# Patient Record
Sex: Male | Born: 1960 | Race: White | Hispanic: No | Marital: Married | Smoking: Current every day smoker
Health system: Southern US, Community
[De-identification: ages and names within clinical notes are randomized; demographics above are authoritative.]

## PROBLEM LIST (undated history)

## (undated) DIAGNOSIS — S0591XA Unspecified injury of right eye and orbit, initial encounter: Secondary | ICD-10-CM

## (undated) DIAGNOSIS — M1990 Unspecified osteoarthritis, unspecified site: Secondary | ICD-10-CM

## (undated) DIAGNOSIS — F419 Anxiety disorder, unspecified: Secondary | ICD-10-CM

## (undated) DIAGNOSIS — Z923 Personal history of irradiation: Secondary | ICD-10-CM

## (undated) DIAGNOSIS — K219 Gastro-esophageal reflux disease without esophagitis: Secondary | ICD-10-CM

## (undated) DIAGNOSIS — G5601 Carpal tunnel syndrome, right upper limb: Secondary | ICD-10-CM

## (undated) DIAGNOSIS — R059 Cough, unspecified: Secondary | ICD-10-CM

## (undated) DIAGNOSIS — C801 Malignant (primary) neoplasm, unspecified: Secondary | ICD-10-CM

## (undated) DIAGNOSIS — R05 Cough: Secondary | ICD-10-CM

## (undated) HISTORY — PX: SHOULDER SURGERY: SHX246

## (undated) HISTORY — PX: EYE SURGERY: SHX253

## (undated) HISTORY — PX: OTHER SURGICAL HISTORY: SHX169

---

## 2007-08-12 ENCOUNTER — Emergency Department (HOSPITAL_COMMUNITY): Admission: EM | Admit: 2007-08-12 | Discharge: 2007-08-12 | Payer: Self-pay | Admitting: Emergency Medicine

## 2011-01-15 HISTORY — PX: EYE SURGERY: SHX253

## 2011-07-12 ENCOUNTER — Other Ambulatory Visit: Payer: Self-pay | Admitting: Family Medicine

## 2011-07-12 DIAGNOSIS — M79609 Pain in unspecified limb: Secondary | ICD-10-CM

## 2011-07-12 DIAGNOSIS — M5410 Radiculopathy, site unspecified: Secondary | ICD-10-CM

## 2011-07-12 DIAGNOSIS — M545 Low back pain: Secondary | ICD-10-CM

## 2011-07-14 ENCOUNTER — Other Ambulatory Visit: Payer: Self-pay

## 2011-07-14 ENCOUNTER — Other Ambulatory Visit: Payer: Self-pay | Admitting: Family Medicine

## 2011-07-14 DIAGNOSIS — Z139 Encounter for screening, unspecified: Secondary | ICD-10-CM

## 2011-07-17 ENCOUNTER — Ambulatory Visit
Admission: RE | Admit: 2011-07-17 | Discharge: 2011-07-17 | Disposition: A | Discharge status: Home / Self Care | Payer: BC Managed Care – PPO | Source: Ambulatory Visit | Attending: Family Medicine | Admitting: Family Medicine

## 2011-07-17 DIAGNOSIS — M545 Low back pain: Secondary | ICD-10-CM

## 2011-07-17 DIAGNOSIS — M79609 Pain in unspecified limb: Secondary | ICD-10-CM

## 2011-07-17 DIAGNOSIS — M5410 Radiculopathy, site unspecified: Secondary | ICD-10-CM

## 2012-10-16 ENCOUNTER — Encounter (HOSPITAL_COMMUNITY): Payer: Self-pay

## 2012-10-16 ENCOUNTER — Other Ambulatory Visit: Payer: Self-pay | Admitting: Neurosurgery

## 2012-10-21 ENCOUNTER — Encounter (HOSPITAL_COMMUNITY)
Admission: RE | Admit: 2012-10-21 | Discharge: 2012-10-21 | Disposition: A | Discharge status: Home / Self Care | Payer: BC Managed Care – PPO | Source: Ambulatory Visit | Attending: Neurosurgery | Admitting: Neurosurgery

## 2012-10-21 ENCOUNTER — Encounter (HOSPITAL_COMMUNITY): Payer: Self-pay

## 2012-10-21 HISTORY — DX: Cough: R05

## 2012-10-21 HISTORY — DX: Unspecified osteoarthritis, unspecified site: M19.90

## 2012-10-21 HISTORY — DX: Unspecified injury of right eye and orbit, initial encounter: S05.91XA

## 2012-10-21 HISTORY — DX: Cough, unspecified: R05.9

## 2012-10-21 HISTORY — DX: Anxiety disorder, unspecified: F41.9

## 2012-10-21 LAB — BASIC METABOLIC PANEL
BUN: 13 mg/dL (ref 6–23)
CO2: 24 mEq/L (ref 19–32)
Calcium: 9.6 mg/dL (ref 8.4–10.5)
Chloride: 101 mEq/L (ref 96–112)
Creatinine, Ser: 0.87 mg/dL (ref 0.50–1.35)
Glucose, Bld: 134 mg/dL — ABNORMAL HIGH (ref 70–99)

## 2012-10-21 LAB — CBC WITH DIFFERENTIAL/PLATELET
Basophils Absolute: 0 10*3/uL (ref 0.0–0.1)
Eosinophils Absolute: 0.1 10*3/uL (ref 0.0–0.7)
Eosinophils Relative: 1 % (ref 0–5)
HCT: 42.8 % (ref 39.0–52.0)
MCH: 32.8 pg (ref 26.0–34.0)
MCHC: 36 g/dL (ref 30.0–36.0)
MCV: 91.3 fL (ref 78.0–100.0)
Monocytes Absolute: 0.7 10*3/uL (ref 0.1–1.0)
Platelets: 339 10*3/uL (ref 150–400)
RDW: 13.6 % (ref 11.5–15.5)

## 2012-10-21 LAB — URINALYSIS, ROUTINE W REFLEX MICROSCOPIC
Bilirubin Urine: NEGATIVE
Ketones, ur: NEGATIVE mg/dL
Leukocytes, UA: NEGATIVE
Nitrite: NEGATIVE
Protein, ur: NEGATIVE mg/dL
pH: 7 (ref 5.0–8.0)

## 2012-10-21 LAB — SURGICAL PCR SCREEN: Staphylococcus aureus: NEGATIVE

## 2012-10-21 MED ORDER — CEFAZOLIN SODIUM-DEXTROSE 2-3 GM-% IV SOLR
2.0000 g | INTRAVENOUS | Status: AC
  Administered 2012-10-22: 2 g via INTRAVENOUS
  Filled 2012-10-21: qty 50

## 2012-10-21 NOTE — Pre-Procedure Instructions (Signed)
Richard Terry  10/21/2012   Your procedure is scheduled on:  10/22/12  Report to [facility] at 530 AM.  Call this number if you have problems the morning of surgery: [phone]   Remember:   Do not eat food or drink liquids after midnight.   Take these medicines the morning of surgery with A SIP OF WATER: zoloft   Do not wear jewelry, make-up or nail polish.  Do not wear lotions, powders, or perfumes. You may wear deodorant.  Do not shave 48 hours prior to surgery. Men may shave face and neck.  Do not bring valuables to the hospital.  Contacts, dentures or bridgework may not be worn into surgery.  Leave suitcase in the car. After surgery it may be brought to your room.  For patients admitted to the hospital, checkout time is 11:00 AM the day of  discharge.   Patients discharged the day of surgery will not be allowed to drive  home.  Name and phone number of your driver: family  Special Instructions: Shower using CHG 2 nights before surgery and the night before surgery.  If you shower the day of surgery use CHG.  Use special wash - you have one bottle of CHG for all showers.  You should use approximately 1/3 of the bottle for each shower.   Please read over the following fact sheets that you were given: Pain Booklet, Coughing and Deep Breathing, MRSA Information and Surgical Site Infection Prevention

## 2012-10-22 ENCOUNTER — Encounter (HOSPITAL_COMMUNITY): Payer: Self-pay | Admitting: *Deleted

## 2012-10-22 ENCOUNTER — Inpatient Hospital Stay (HOSPITAL_COMMUNITY): Payer: BC Managed Care – PPO

## 2012-10-22 ENCOUNTER — Inpatient Hospital Stay (HOSPITAL_COMMUNITY): Payer: BC Managed Care – PPO | Admitting: *Deleted

## 2012-10-22 ENCOUNTER — Inpatient Hospital Stay (HOSPITAL_COMMUNITY)
Admission: RE | Admit: 2012-10-22 | Discharge: 2012-10-23 | DRG: 864 | Disposition: A | Discharge status: Home / Self Care | Payer: BC Managed Care – PPO | Source: Ambulatory Visit | Attending: Neurosurgery | Admitting: Neurosurgery

## 2012-10-22 ENCOUNTER — Encounter (HOSPITAL_COMMUNITY)
Admission: RE | Disposition: A | Discharge status: Home / Self Care | Payer: Self-pay | Source: Ambulatory Visit | Attending: Neurosurgery

## 2012-10-22 ENCOUNTER — Encounter (HOSPITAL_COMMUNITY): Payer: Self-pay | Admitting: Surgery

## 2012-10-22 DIAGNOSIS — F411 Generalized anxiety disorder: Secondary | ICD-10-CM | POA: Diagnosis present

## 2012-10-22 DIAGNOSIS — Z87891 Personal history of nicotine dependence: Secondary | ICD-10-CM

## 2012-10-22 DIAGNOSIS — J45909 Unspecified asthma, uncomplicated: Secondary | ICD-10-CM | POA: Diagnosis present

## 2012-10-22 DIAGNOSIS — M5000 Cervical disc disorder with myelopathy, unspecified cervical region: Principal | ICD-10-CM | POA: Diagnosis present

## 2012-10-22 DIAGNOSIS — M4712 Other spondylosis with myelopathy, cervical region: Secondary | ICD-10-CM | POA: Diagnosis present

## 2012-10-22 HISTORY — PX: ANTERIOR CERVICAL DECOMP/DISCECTOMY FUSION: SHX1161

## 2012-10-22 SURGERY — ANTERIOR CERVICAL DECOMPRESSION/DISCECTOMY FUSION 3 LEVELS
Anesthesia: General | Site: Neck | Wound class: Clean

## 2012-10-22 MED ORDER — OXYCODONE HCL 5 MG PO TABS: 5.0000 mg | ORAL_TABLET | Freq: Once | ORAL | Status: DC | PRN

## 2012-10-22 MED ORDER — THROMBIN 5000 UNITS EX SOLR
OROMUCOSAL | Status: DC | PRN
  Administered 2012-10-22: 10:00:00 via TOPICAL

## 2012-10-22 MED ORDER — DEXAMETHASONE SODIUM PHOSPHATE 4 MG/ML IJ SOLN
INTRAMUSCULAR | Status: DC | PRN
  Administered 2012-10-22: 8 mg via INTRAVENOUS

## 2012-10-22 MED ORDER — PHENYLEPHRINE HCL 10 MG/ML IJ SOLN
INTRAMUSCULAR | Status: DC | PRN
  Administered 2012-10-22: 80 ug via INTRAVENOUS

## 2012-10-22 MED ORDER — MORPHINE SULFATE 2 MG/ML IJ SOLN: 1.0000 mg | INTRAMUSCULAR | Status: DC | PRN

## 2012-10-22 MED ORDER — METHOCARBAMOL 500 MG PO TABS
500.0000 mg | ORAL_TABLET | Freq: Four times a day (QID) | ORAL | Status: DC | PRN
  Administered 2012-10-22: 500 mg via ORAL
  Filled 2012-10-22: qty 1

## 2012-10-22 MED ORDER — SODIUM CHLORIDE 0.9 % IV SOLN
INTRAVENOUS | Status: AC
  Filled 2012-10-22: qty 500

## 2012-10-22 MED ORDER — LIDOCAINE HCL (CARDIAC) 20 MG/ML IV SOLN
INTRAVENOUS | Status: DC | PRN
  Administered 2012-10-22: 60 mg via INTRAVENOUS

## 2012-10-22 MED ORDER — HYDROMORPHONE HCL PF 1 MG/ML IJ SOLN
INTRAMUSCULAR | Status: AC
  Filled 2012-10-22: qty 1

## 2012-10-22 MED ORDER — HYDROMORPHONE HCL PF 1 MG/ML IJ SOLN
0.2500 mg | INTRAMUSCULAR | Status: DC | PRN
  Administered 2012-10-22 (×2): 0.5 mg via INTRAVENOUS

## 2012-10-22 MED ORDER — PROMETHAZINE HCL 25 MG/ML IJ SOLN: 12.5000 mg | INTRAMUSCULAR | Status: DC | PRN

## 2012-10-22 MED ORDER — ONDANSETRON HCL 4 MG/2ML IJ SOLN: 4.0000 mg | INTRAMUSCULAR | Status: DC | PRN

## 2012-10-22 MED ORDER — ALUM & MAG HYDROXIDE-SIMETH 200-200-20 MG/5ML PO SUSP
30.0000 mL | Freq: Four times a day (QID) | ORAL | Status: DC | PRN
  Administered 2012-10-22: 30 mL via ORAL
  Filled 2012-10-22: qty 30

## 2012-10-22 MED ORDER — ROCURONIUM BROMIDE 100 MG/10ML IV SOLN
INTRAVENOUS | Status: DC | PRN
  Administered 2012-10-22: 20 mg via INTRAVENOUS
  Administered 2012-10-22: 30 mg via INTRAVENOUS
  Administered 2012-10-22: 50 mg via INTRAVENOUS

## 2012-10-22 MED ORDER — FENTANYL CITRATE 0.05 MG/ML IJ SOLN
INTRAMUSCULAR | Status: DC | PRN
  Administered 2012-10-22: 100 ug via INTRAVENOUS
  Administered 2012-10-22: 50 ug via INTRAVENOUS
  Administered 2012-10-22: 150 ug via INTRAVENOUS
  Administered 2012-10-22: 100 ug via INTRAVENOUS

## 2012-10-22 MED ORDER — SODIUM CHLORIDE 0.9 % IV SOLN: 250.0000 mL | INTRAVENOUS | Status: DC

## 2012-10-22 MED ORDER — SODIUM CHLORIDE 0.9 % IJ SOLN
3.0000 mL | Freq: Two times a day (BID) | INTRAMUSCULAR | Status: DC
  Administered 2012-10-22: 3 mL via INTRAVENOUS

## 2012-10-22 MED ORDER — THROMBIN 5000 UNITS EX SOLR
CUTANEOUS | Status: DC | PRN
  Administered 2012-10-22 (×4): 5000 [IU] via TOPICAL

## 2012-10-22 MED ORDER — MIDAZOLAM HCL 5 MG/5ML IJ SOLN
INTRAMUSCULAR | Status: DC | PRN
  Administered 2012-10-22: 2 mg via INTRAVENOUS

## 2012-10-22 MED ORDER — MENTHOL 3 MG MT LOZG: 1.0000 | LOZENGE | OROMUCOSAL | Status: DC | PRN

## 2012-10-22 MED ORDER — PROMETHAZINE HCL 25 MG/ML IJ SOLN: 6.2500 mg | INTRAMUSCULAR | Status: DC | PRN

## 2012-10-22 MED ORDER — LACTATED RINGERS IV SOLN: INTRAVENOUS | Status: DC

## 2012-10-22 MED ORDER — CYCLOBENZAPRINE HCL 10 MG PO TABS
10.0000 mg | ORAL_TABLET | Freq: Three times a day (TID) | ORAL | Status: DC | PRN
  Administered 2012-10-22: 10 mg via ORAL
  Filled 2012-10-22: qty 1

## 2012-10-22 MED ORDER — DOCUSATE SODIUM 100 MG PO CAPS
100.0000 mg | ORAL_CAPSULE | Freq: Two times a day (BID) | ORAL | Status: DC
  Administered 2012-10-22 (×2): 100 mg via ORAL
  Filled 2012-10-22 (×2): qty 1

## 2012-10-22 MED ORDER — LACTATED RINGERS IV SOLN
INTRAVENOUS | Status: DC | PRN
  Administered 2012-10-22 (×3): via INTRAVENOUS

## 2012-10-22 MED ORDER — BACITRACIN 50000 UNITS IM SOLR
INTRAMUSCULAR | Status: AC
  Filled 2012-10-22: qty 1

## 2012-10-22 MED ORDER — MEPERIDINE HCL 25 MG/ML IJ SOLN: 6.2500 mg | INTRAMUSCULAR | Status: DC | PRN

## 2012-10-22 MED ORDER — PROPOFOL 10 MG/ML IV BOLUS
INTRAVENOUS | Status: DC | PRN
  Administered 2012-10-22: 200 mg via INTRAVENOUS

## 2012-10-22 MED ORDER — ONDANSETRON HCL 4 MG/2ML IJ SOLN
INTRAMUSCULAR | Status: DC | PRN
  Administered 2012-10-22: 4 mg via INTRAVENOUS

## 2012-10-22 MED ORDER — PROMETHAZINE HCL 25 MG PO TABS: 12.5000 mg | ORAL_TABLET | ORAL | Status: DC | PRN

## 2012-10-22 MED ORDER — ACETAMINOPHEN 325 MG PO TABS: 650.0000 mg | ORAL_TABLET | ORAL | Status: DC | PRN

## 2012-10-22 MED ORDER — HYDROCODONE-ACETAMINOPHEN 5-325 MG PO TABS: 1.0000 | ORAL_TABLET | ORAL | Status: DC | PRN

## 2012-10-22 MED ORDER — NEOSTIGMINE METHYLSULFATE 1 MG/ML IJ SOLN
INTRAMUSCULAR | Status: DC | PRN
  Administered 2012-10-22: 3.5 mg via INTRAVENOUS

## 2012-10-22 MED ORDER — PHENOL 1.4 % MT LIQD: 1.0000 | OROMUCOSAL | Status: DC | PRN

## 2012-10-22 MED ORDER — OXYCODONE HCL 5 MG/5ML PO SOLN: 5.0000 mg | Freq: Once | ORAL | Status: DC | PRN

## 2012-10-22 MED ORDER — KETOROLAC TROMETHAMINE 30 MG/ML IJ SOLN
30.0000 mg | Freq: Four times a day (QID) | INTRAMUSCULAR | Status: DC
  Administered 2012-10-22 – 2012-10-23 (×3): 30 mg via INTRAVENOUS
  Filled 2012-10-22 (×3): qty 1

## 2012-10-22 MED ORDER — LIDOCAINE-EPINEPHRINE 1 %-1:100000 IJ SOLN
INTRAMUSCULAR | Status: DC | PRN
  Administered 2012-10-22: 10 mL

## 2012-10-22 MED ORDER — OXYCODONE-ACETAMINOPHEN 5-325 MG PO TABS
1.0000 | ORAL_TABLET | ORAL | Status: DC | PRN
  Administered 2012-10-22 (×3): 2 via ORAL
  Filled 2012-10-22 (×3): qty 2

## 2012-10-22 MED ORDER — MAGNESIUM HYDROXIDE 400 MG/5ML PO SUSP: 30.0000 mL | Freq: Every day | ORAL | Status: DC | PRN

## 2012-10-22 MED ORDER — VECURONIUM BROMIDE 10 MG IV SOLR
INTRAVENOUS | Status: DC | PRN
  Administered 2012-10-22 (×2): 2 mg via INTRAVENOUS

## 2012-10-22 MED ORDER — GLYCOPYRROLATE 0.2 MG/ML IJ SOLN
INTRAMUSCULAR | Status: DC | PRN
  Administered 2012-10-22: .7 mg via INTRAVENOUS

## 2012-10-22 MED ORDER — ISOPROPYL ALCOHOL 70 % SOLN
Status: DC | PRN
  Administered 2012-10-22: 1 via TOPICAL

## 2012-10-22 MED ORDER — SODIUM CHLORIDE 0.9 % IR SOLN
Status: DC | PRN
  Administered 2012-10-22 (×2)

## 2012-10-22 MED ORDER — CEFAZOLIN SODIUM-DEXTROSE 2-3 GM-% IV SOLR
2.0000 g | Freq: Three times a day (TID) | INTRAVENOUS | Status: AC
  Administered 2012-10-22 (×2): 2 g via INTRAVENOUS
  Filled 2012-10-22 (×2): qty 50

## 2012-10-22 MED ORDER — BISACODYL 10 MG RE SUPP: 10.0000 mg | Freq: Every day | RECTAL | Status: DC | PRN

## 2012-10-22 MED ORDER — ZOLPIDEM TARTRATE 5 MG PO TABS: 5.0000 mg | ORAL_TABLET | Freq: Every evening | ORAL | Status: DC | PRN

## 2012-10-22 MED ORDER — 0.9 % SODIUM CHLORIDE (POUR BTL) OPTIME
TOPICAL | Status: DC | PRN
  Administered 2012-10-22: 1000 mL

## 2012-10-22 MED ORDER — EPHEDRINE SULFATE 50 MG/ML IJ SOLN
INTRAMUSCULAR | Status: DC | PRN
  Administered 2012-10-22: 5 mg via INTRAVENOUS

## 2012-10-22 MED ORDER — HEMOSTATIC AGENTS (NO CHARGE) OPTIME
TOPICAL | Status: DC | PRN
  Administered 2012-10-22 (×2): 1 via TOPICAL

## 2012-10-22 MED ORDER — SODIUM CHLORIDE 0.9 % IJ SOLN: 3.0000 mL | INTRAMUSCULAR | Status: DC | PRN

## 2012-10-22 MED ORDER — MIDAZOLAM HCL 2 MG/2ML IJ SOLN
0.5000 mg | Freq: Once | INTRAMUSCULAR | Status: AC | PRN
  Administered 2012-10-22: 2 mg via INTRAVENOUS

## 2012-10-22 MED ORDER — METHOCARBAMOL 100 MG/ML IJ SOLN
500.0000 mg | Freq: Four times a day (QID) | INTRAMUSCULAR | Status: DC | PRN
  Filled 2012-10-22: qty 5

## 2012-10-22 MED ORDER — ACETAMINOPHEN 650 MG RE SUPP: 650.0000 mg | RECTAL | Status: DC | PRN

## 2012-10-22 MED ORDER — SERTRALINE HCL 50 MG PO TABS
150.0000 mg | ORAL_TABLET | Freq: Every day | ORAL | Status: DC
  Filled 2012-10-22 (×2): qty 1

## 2012-10-22 SURGICAL SUPPLY — 57 items
BAG DECANTER FOR FLEXI CONT (MISCELLANEOUS) ×4 IMPLANT
BANDAGE GAUZE ELAST BULKY 4 IN (GAUZE/BANDAGES/DRESSINGS) ×4 IMPLANT
BENZOIN TINCTURE PRP APPL 2/3 (GAUZE/BANDAGES/DRESSINGS) ×2 IMPLANT
BIT DRILL 14MM (INSTRUMENTS) ×1 IMPLANT
BUR MATCHSTICK NEURO 3.0 LAGG (BURR) ×2 IMPLANT
CANISTER SUCTION 2500CC (MISCELLANEOUS) ×2 IMPLANT
CLOTH BEACON ORANGE TIMEOUT ST (SAFETY) ×2 IMPLANT
CONT SPEC 4OZ CLIKSEAL STRL BL (MISCELLANEOUS) ×2 IMPLANT
DRAPE LAPAROTOMY 100X72 PEDS (DRAPES) ×2 IMPLANT
DRAPE MICROSCOPE LEICA (MISCELLANEOUS) ×2 IMPLANT
DRAPE POUCH INSTRU U-SHP 10X18 (DRAPES) ×2 IMPLANT
DRILL 14MM (INSTRUMENTS) ×2
DURAPREP 6ML APPLICATOR 50/CS (WOUND CARE) ×2 IMPLANT
ELECT REM PT RETURN 9FT ADLT (ELECTROSURGICAL) ×2
ELECTRODE REM PT RTRN 9FT ADLT (ELECTROSURGICAL) ×1 IMPLANT
GAUZE SPONGE 4X4 16PLY XRAY LF (GAUZE/BANDAGES/DRESSINGS) IMPLANT
GLOVE BIO SURGEON STRL SZ8 (GLOVE) ×2 IMPLANT
GLOVE BIOGEL PI IND STRL 8.5 (GLOVE) ×2 IMPLANT
GLOVE BIOGEL PI INDICATOR 8.5 (GLOVE) ×2
GLOVE ECLIPSE 7.5 STRL STRAW (GLOVE) ×10 IMPLANT
GLOVE EXAM NITRILE LRG STRL (GLOVE) IMPLANT
GLOVE EXAM NITRILE MD LF STRL (GLOVE) IMPLANT
GLOVE EXAM NITRILE XL STR (GLOVE) IMPLANT
GLOVE EXAM NITRILE XS STR PU (GLOVE) IMPLANT
GLOVE INDICATOR 8.5 STRL (GLOVE) ×2 IMPLANT
GOWN BRE IMP SLV AUR LG STRL (GOWN DISPOSABLE) ×2 IMPLANT
GOWN BRE IMP SLV AUR XL STRL (GOWN DISPOSABLE) ×4 IMPLANT
GOWN STRL REIN 2XL LVL4 (GOWN DISPOSABLE) ×2 IMPLANT
HEAD HALTER (SOFTGOODS) ×2 IMPLANT
HEMOSTAT POWDER SURGIFOAM 1G (HEMOSTASIS) ×2 IMPLANT
KIT BASIN OR (CUSTOM PROCEDURE TRAY) ×2 IMPLANT
KIT ROOM TURNOVER OR (KITS) ×2 IMPLANT
NEEDLE HYPO 22GX1.5 SAFETY (NEEDLE) ×4 IMPLANT
NEEDLE HYPO 25X1 1.5 SAFETY (NEEDLE) ×2 IMPLANT
NEEDLE SPNL 20GX3.5 QUINCKE YW (NEEDLE) ×2 IMPLANT
NS IRRIG 1000ML POUR BTL (IV SOLUTION) ×2 IMPLANT
PACK LAMINECTOMY NEURO (CUSTOM PROCEDURE TRAY) ×2 IMPLANT
PATTIES SURGICAL .5 X.5 (GAUZE/BANDAGES/DRESSINGS) ×2 IMPLANT
PATTIES SURGICAL .75X.75 (GAUZE/BANDAGES/DRESSINGS) ×2 IMPLANT
PIN DISTRACTION 14MM (PIN) ×4 IMPLANT
PLATE 51MM (Plate) ×1 IMPLANT
PLATE 51X3 LVL NS SPNE CVD (Plate) ×1 IMPLANT
RUBBERBAND STERILE (MISCELLANEOUS) ×4 IMPLANT
SCREW 14MM (Screw) ×16 IMPLANT
SPACER ASSEM CERV LORD 6M (Spacer) ×2 IMPLANT
SPACER ASSEM CERV LORD 7M (Spacer) ×4 IMPLANT
SPONGE GAUZE 4X4 12PLY (GAUZE/BANDAGES/DRESSINGS) ×2 IMPLANT
SPONGE INTESTINAL PEANUT (DISPOSABLE) ×2 IMPLANT
SPONGE SURGIFOAM ABS GEL SZ50 (HEMOSTASIS) ×4 IMPLANT
STRIP CLOSURE SKIN 1/2X4 (GAUZE/BANDAGES/DRESSINGS) ×2 IMPLANT
SUT VIC AB 3-0 SH 8-18 (SUTURE) ×2 IMPLANT
SYR 20ML ECCENTRIC (SYRINGE) ×2 IMPLANT
TAPE CLOTH SURG 4X10 WHT LF (GAUZE/BANDAGES/DRESSINGS) ×2 IMPLANT
TOWEL OR 17X24 6PK STRL BLUE (TOWEL DISPOSABLE) ×2 IMPLANT
TOWEL OR 17X26 10 PK STRL BLUE (TOWEL DISPOSABLE) ×2 IMPLANT
TRAY FOLEY CATH 14FRSI W/METER (CATHETERS) ×2 IMPLANT
WATER STERILE IRR 1000ML POUR (IV SOLUTION) ×2 IMPLANT

## 2012-10-22 NOTE — Anesthesia Procedure Notes (Addendum)
Procedure Name: Intubation Date/Time: 10/22/2012 7:51 AM Performed by: Quentin Ore Pre-anesthesia Checklist: Patient identified Patient Re-evaluated:Patient Re-evaluated prior to inductionOxygen Delivery Method: Circle system utilized Preoxygenation: Pre-oxygenation with 100% oxygen Intubation Type: IV induction Ventilation: Mask ventilation without difficulty Grade View: Grade I Tube type: Oral Tube size: 7.5 mm Number of attempts: 1 Airway Equipment and Method: Video-laryngoscopy,  Stylet and Bite block Placement Confirmation: ETT inserted through vocal cords under direct vision,  positive ETCO2,  CO2 detector and breath sounds checked- equal and bilateral Secured at: 23 cm Tube secured with: Tape Dental Injury: Teeth and Oropharynx as per pre-operative assessment  Comments: Elective glidescope d/t cervical myelopathy. Head and neck maintained in neutral and midline position without extension.

## 2012-10-22 NOTE — Preoperative (Signed)
Beta Blockers   Reason not to administer Beta Blockers:Not Applicable 

## 2012-10-22 NOTE — H&P (Signed)
See H& P.

## 2012-10-22 NOTE — Anesthesia Postprocedure Evaluation (Signed)
  Anesthesia Post-op Note  Patient: DANILE TRIER  Procedure(s) Performed: Procedure(s) with comments: ANTERIOR CERVICAL DECOMPRESSION/DISCECTOMY FUSION 3 LEVELS (N/A) - Cervical four-five,five-six,six-seven Anterior cervical decompression/diskectomy/fusion/allograft/trestle plate  Patient Location: PACU  Anesthesia Type:General  Level of Consciousness: awake, alert , oriented and patient cooperative  Airway and Oxygen Therapy: Patient Spontanous Breathing and Patient connected to nasal cannula oxygen  Post-op Pain: none  Post-op Assessment: Post-op Vital signs reviewed, Patient's Cardiovascular Status Stable, Respiratory Function Stable, Patent Airway, No signs of Nausea or vomiting and Pain level controlled  Post-op Vital Signs: Reviewed and stable  Complications: No apparent anesthesia complications

## 2012-10-22 NOTE — Progress Notes (Signed)
UR COMPLETED  

## 2012-10-22 NOTE — Plan of Care (Signed)
Problem: Consults Goal: Diagnosis - Spinal Surgery Outcome: Completed/Met Date Met:  10/22/12 Cervical Spine Fusion     

## 2012-10-22 NOTE — Anesthesia Preprocedure Evaluation (Addendum)
Anesthesia Evaluation  Patient identified by MRN, date of birth, ID band Patient awake    Reviewed: Allergy & Precautions, H&P , NPO status , Patient's Chart, lab work & pertinent test results  History of Anesthesia Complications Negative for: history of anesthetic complications  Airway Mallampati: I TM Distance: >3 FB Neck ROM: Full    Dental  (+) Teeth Intact and Dental Advisory Given   Pulmonary asthma , former smoker,  breath sounds clear to auscultation  Pulmonary exam normal       Cardiovascular negative cardio ROS  Rhythm:Regular Rate:Normal     Neuro/Psych Anxiety    GI/Hepatic negative GI ROS, Neg liver ROS,   Endo/Other  negative endocrine ROS  Renal/GU negative Renal ROS     Musculoskeletal   Abdominal   Peds  Hematology negative hematology ROS (+)   Anesthesia Other Findings   Reproductive/Obstetrics                         Anesthesia Physical Anesthesia Plan  ASA: II  Anesthesia Plan: General   Post-op Pain Management:    Induction: Intravenous  Airway Management Planned: Oral ETT and Video Laryngoscope Planned  Additional Equipment:   Intra-op Plan:   Post-operative Plan: Extubation in OR  Informed Consent: I have reviewed the patients History and Physical, chart, labs and discussed the procedure including the risks, benefits and alternatives for the proposed anesthesia with the patient or authorized representative who has indicated his/her understanding and acceptance.   Dental advisory given  Plan Discussed with: Surgeon and CRNA  Anesthesia Plan Comments: (Plan routine monitors, GETA with VideoGlide intubation (myelopathic))        Anesthesia Quick Evaluation

## 2012-10-22 NOTE — Transfer of Care (Signed)
Immediate Anesthesia Transfer of Care Note  Patient: Richard Terry  Procedure(s) Performed: Procedure(s) with comments: ANTERIOR CERVICAL DECOMPRESSION/DISCECTOMY FUSION 3 LEVELS (N/A) - Cervical four-five,five-six,six-seven Anterior cervical decompression/diskectomy/fusion/allograft/trestle plate  Patient Location: PACU  Anesthesia Type:General  Level of Consciousness: awake, alert  and oriented  Airway & Oxygen Therapy: Patient Spontanous Breathing and Patient connected to face mask oxygen  Post-op Assessment: Report given to PACU RN, Post -op Vital signs reviewed and stable and Patient moving all extremities X 4  Post vital signs: Reviewed and stable  Complications: No apparent anesthesia complications

## 2012-10-22 NOTE — Interval H&P Note (Signed)
History and Physical Interval Note:  10/22/2012 7:34 AM  Richard Terry  has presented today for surgery, with the diagnosis of Cervical hnp with myelopathy, Cervical spondylosis with myelopathy, Cervical stenosis  The various methods of treatment have been discussed with the patient and family. After consideration of risks, benefits and other options for treatment, the patient has consented to  Procedure(s) with comments: ANTERIOR CERVICAL DECOMPRESSION/DISCECTOMY FUSION 3 LEVELS (N/A) - C4-5 C5-6 C6-7 Anterior cervical decompression/diskectomy/fusion/allograft/trestle plate as a surgical intervention .  The patient's history has been reviewed, patient examined, no change in status, stable for surgery.  I have reviewed the patient's chart and labs.  Questions were answered to the patient's satisfaction.     [name] R

## 2012-10-22 NOTE — Op Note (Signed)
10/22/2012  11:04 AM  PATIENT:  Richard Terry  52 y.o. male  PRE-OPERATIVE DIAGNOSIS:  Cervical hnp with myelopathy, Cervical spondylosis with myelopathy, Cervical stenosis, C4-5, 5-6, 6-7  POST-OPERATIVE DIAGNOSIS:    Cervical hnp with myelopathy, Cervical spondylosis with myelopathy, Cervical stenosis, C4-5, 5-6, 6-7   PROCEDURE:  Procedure(s): ANTERIOR CERVICAL DECOMPRESSION/DISCECTOMY FUSION 3 LEVELS ( C4-5, 5-6, 6-7), structural allograft, trestle plate  SURGEON:  Surgeon(s): Clydene Fake, MD Maeola Harman, MD-assist   ANESTHESIA:   general  EBL:  Total I/O In: 2000 [I.V.:2000] Out: 405 [Urine:205; Blood:200]  BLOOD ADMINISTERED:none  DRAINS: none   SPECIMEN:  No Specimen  DICTATION: Patient with neck and bilateral arm pain numbness and weakness left side symptoms been going on for over year right-sided symptoms with last few months tumor was done showing large disc herniation central and to the right causing some cord compression C4-5 level and severe spinal change at 56 and 67 with worsened side frontal narrowing patient brought in for decompression fusion at these levels.  Patient brought into operating room general anesthesia induced patient placed in 10 pounds halter traction prepped draped sterile fashion 7 incision injected with 10 cc 1% lidocaine with epinephrine. Incision was then made from the midline to the intracerebral Strickland aggressive muscle the neck and left side incision taken of the platysma hemostasis obtained with Bovie cauterization the platysma was incised with a Bovie and blunt dissection taken to the intracerebral fascia the intracerebral spine. Needles placed interspace x-rays attention needle was at the 56 space. The space incised and partial discectomy done with pituitary rongeurs as the needle was removed. Lungs: Muscles reflected laterally using the Bovie from C4-C7 starting retractors were placed we could see the 4556 levels disc space were  then incised with 15 blade and discectomy done with pituitary rongeurs and dressed right removed with Kerrison punches distraction pins placed in C4 and C6 interspaces distracted. Microscope brought in for microdissection this point. Starting 45 discectomy continued with curettes and 1 to her Kerrison punches were used to remove posterior discussed ligament decompressed the central canal performing bilateral foraminotomies free fragment disc was removed from the right paracentral area deficit 45 area decompression the cord were finished we did decompression of the cord and nerve roots we is high-speed drill to remove callus endplate measured the space to be 7 mm a 7 mm trial of bone and tapped in place. Attention taken of the 56 levels where discectomy done with high-speed drill and then went to more Kerrison punches were used to remove remove posterior ligament osteophyte decompressing the central canal performing bilateral foraminotomies especially the left side and we opened up at both sides of the nerve roots were well decompressed. Measured the space to be 6 mm dissection or structural postop place. The distraction was room the distraction pin removed from see for hemostasis obtained with Gelfoam thrombin we then removed the retractors suite and see the 67 level and placed a distraction pin C7 and distracted across 67 with the pins. Discectomy then continued with due to rongeurs and a curettes and 1 and 2 mm Kerrison punches were used to remove posterior ligament osteophyte and disc decompressing the central canal bilateral nerve roots were well decompressed. We is high-speed drill to remove from the Simplex measured height a displaced to be 7 mm a 7 mm structural allograft was put in place. Distraction distraction pins removed hemostasis obtained Gelfoam thrombin weight was removed the traction bone plugs were in good firm position  and dressed right removed with some rongeurs trestle anterior cervical plate  was placed over the intracerebral spine and 2 screws placed in C4 and C5-C6 and C7 these were final tightened lateral x-rays obtained showing good position plate-screw bone plugs at C4-5-5 6-6/7. Retractors removed we urine about solution we did hemostasis and the platysma was closed through Vicryl interrupted sutures subcutaneous tissue tissue closed the same skin closed benzoin Steri-Strips dressing was placed patient saucer color woken (and transferred recovery.  PLAN OF CARE: Admit to inpatient   PATIENT DISPOSITION:  PACU - hemodynamically stable.

## 2012-10-23 ENCOUNTER — Encounter (HOSPITAL_COMMUNITY): Payer: Self-pay | Admitting: Neurosurgery

## 2012-10-23 MED ORDER — OXYCODONE-ACETAMINOPHEN 5-325 MG PO TABS: 1.0000 | ORAL_TABLET | ORAL | Status: DC | PRN

## 2012-10-23 MED ORDER — CYCLOBENZAPRINE HCL 10 MG PO TABS: 10.0000 mg | ORAL_TABLET | Freq: Three times a day (TID) | ORAL | Status: DC | PRN

## 2012-10-23 NOTE — Progress Notes (Signed)
Pt. discharged home accompanied by spouse. Prescriptions and discharge instructions given with verbalization of understanding. Incision site on neck with no s/s of infection - no swelling, redness, bleeding, and/or drainage noted. Soft collar intact. Opportunity given to ask questions but no question asked. Pt. transported out of this unit in wheelchair by the volunteer. 

## 2012-10-23 NOTE — Discharge Summary (Signed)
Physician Discharge Summary  Patient ID: Richard Terry MRN: [id number] DOB/AGE: 12/27/1960 52 y.o.  Admit date: 10/22/2012 Discharge date: 10/23/2012  Admission Diagnoses:Cervical hnp with myelopathy, Cervical spondylosis with myelopathy, Cervical stenosis, C4-5, 5-6, 6-7      Discharge Diagnoses: Cervical hnp with myelopathy, Cervical spondylosis with myelopathy, Cervical stenosis, C4-5, 5-6, 6-7     Active Problems:   * No active hospital problems. *   Discharged Condition: good  Hospital Course: pt admitted on day of surgery , underwent procedure below - pt doing well -  No arm pain, numbness  - eating, voiding, voice ok  Consults: None  Significant Diagnostic Studies: none  Treatments: surgery: ANTERIOR CERVICAL DECOMPRESSION/DISCECTOMY FUSION 3 LEVELS ( C4-5, 5-6, 6-7), structural allograft, trestle plate      Discharge Exam: Blood pressure 136/86, pulse 78, temperature 97.8 F (36.6 C), temperature source Oral, resp. rate 18, SpO2 97.00%. Wound:c/d/i  Disposition: home     Medication List    STOP taking these medications       diclofenac 75 MG EC tablet  Commonly known as:  VOLTAREN     methylPREDNISolone 4 MG tablet  Commonly known as:  MEDROL DOSEPAK      TAKE these medications       cyclobenzaprine 10 MG tablet  Commonly known as:  FLEXERIL  Take 1 tablet (10 mg total) by mouth 3 (three) times daily as needed for muscle spasms.     oxyCODONE-acetaminophen 5-325 MG per tablet  Commonly known as:  PERCOCET/ROXICET  Take 1-2 tablets by mouth every 4 (four) hours as needed for pain.     ROLAIDS PO  Take 1-2 tablets by mouth at bedtime as needed (heart burn).     sertraline 100 MG tablet  Commonly known as:  ZOLOFT  Take 150 mg by mouth daily.         SignedClydene Fake, MD 10/23/2012, 8:24 AM

## 2014-10-10 ENCOUNTER — Encounter (HOSPITAL_COMMUNITY): Payer: Self-pay | Admitting: *Deleted

## 2014-10-10 DIAGNOSIS — M1990 Unspecified osteoarthritis, unspecified site: Secondary | ICD-10-CM | POA: Diagnosis not present

## 2014-10-10 DIAGNOSIS — F419 Anxiety disorder, unspecified: Secondary | ICD-10-CM | POA: Insufficient documentation

## 2014-10-10 DIAGNOSIS — Z79899 Other long term (current) drug therapy: Secondary | ICD-10-CM | POA: Insufficient documentation

## 2014-10-10 DIAGNOSIS — Y92410 Unspecified street and highway as the place of occurrence of the external cause: Secondary | ICD-10-CM | POA: Diagnosis not present

## 2014-10-10 DIAGNOSIS — S4991XA Unspecified injury of right shoulder and upper arm, initial encounter: Secondary | ICD-10-CM | POA: Diagnosis present

## 2014-10-10 DIAGNOSIS — Y939 Activity, unspecified: Secondary | ICD-10-CM | POA: Insufficient documentation

## 2014-10-10 DIAGNOSIS — Y999 Unspecified external cause status: Secondary | ICD-10-CM | POA: Diagnosis not present

## 2014-10-10 DIAGNOSIS — S43101A Unspecified dislocation of right acromioclavicular joint, initial encounter: Secondary | ICD-10-CM | POA: Insufficient documentation

## 2014-10-10 DIAGNOSIS — Z720 Tobacco use: Secondary | ICD-10-CM | POA: Insufficient documentation
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
## 2014-10-11 ENCOUNTER — Emergency Department (HOSPITAL_COMMUNITY)
Admission: EM | Admit: 2014-10-11 | Discharge: 2014-10-11 | Disposition: A | Discharge status: Home / Self Care | Payer: BLUE CROSS/BLUE SHIELD | Attending: Emergency Medicine | Admitting: Emergency Medicine

## 2014-10-11 ENCOUNTER — Emergency Department (HOSPITAL_COMMUNITY): Payer: BLUE CROSS/BLUE SHIELD

## 2014-10-11 DIAGNOSIS — S43101A Unspecified dislocation of right acromioclavicular joint, initial encounter: Secondary | ICD-10-CM

## 2014-10-11 MED ORDER — HYDROCODONE-ACETAMINOPHEN 5-325 MG PO TABS: 1.0000 | ORAL_TABLET | Freq: Four times a day (QID) | ORAL | Status: DC | PRN

## 2014-10-11 MED ORDER — HYDROCODONE-ACETAMINOPHEN 5-325 MG PO TABS
1.0000 | ORAL_TABLET | Freq: Once | ORAL | Status: AC
  Administered 2014-10-11: 1 via ORAL
  Filled 2014-10-11: qty 1

## 2014-10-11 MED ORDER — NAPROXEN 250 MG PO TABS
500.0000 mg | ORAL_TABLET | Freq: Once | ORAL | Status: AC
  Administered 2014-10-11: 500 mg via ORAL
  Filled 2014-10-11: qty 2

## 2014-10-11 MED ORDER — NAPROXEN 500 MG PO TABS: ORAL_TABLET | ORAL | Status: DC
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
## 2014-10-11 NOTE — Discharge Instructions (Signed)
Use ice packs to the injured area. Take the medication as prescribed. Wear the shoulder immobilizer until you can recheck by either the orthopedist to his senior son in the past or Dr. Lorin Mercy, the orthopedist on call tonight.   Acromioclavicular Injuries The acromioclavicular [facility]) joint is the joint in the shoulder. There are many bands of tissue (ligaments) that surround the [facility] bones and joints. These bands of tissue can tear, which can lead to sprains and separations. The bones of the [facility] joint can also break (fracture).  HOME CARE   Put ice on the injured area.  Put ice in a plastic bag.  Place a towel between your skin and the bag.  Leave the ice on for 15-20 minutes, 03-04 times a day.  Wear your sling as told by your doctor. Remove the sling before showering and bathing. Keep the shoulder in the same place as when the sling is on. Do not lift the arm.  Only take medicine as told by your doctor.  Keep all follow-up visits with your doctor. GET HELP RIGHT AWAY IF:   Your medicine does not help your pain.  You have more puffiness (swelling) or your bruising gets worse rather than better.  You were unable to follow up as told by your doctor.  You have tingling or lose even more feeling in your arm, forearm, or hand.  Your arm is cold or pale.  You have more pain in the hand, forearm, or fingers. MAKE SURE YOU:   Understand these instructions.  Will watch your condition.  Will get help right away if you are not doing well or get worse. Document Released: 12/21/2009 Document Revised: 09/25/2011 Document Reviewed: 12/21/2009 [facility] Patient Information 2015 [place], [place]. This information is not intended to replace advice given to you by your health care provider. Make sure you discuss any questions you have with your health care provider.

## 2014-10-11 NOTE — ED Notes (Signed)
Pt back from xray stating he feels faint. Vs rechecked and pt stable. sts he is feeling better now.

## 2014-10-16 HISTORY — PX: SHOULDER SURGERY: SHX246

## 2016-01-13 ENCOUNTER — Emergency Department (HOSPITAL_COMMUNITY): Payer: BLUE CROSS/BLUE SHIELD

## 2016-01-13 ENCOUNTER — Encounter (HOSPITAL_COMMUNITY): Payer: Self-pay | Admitting: *Deleted

## 2016-01-13 ENCOUNTER — Emergency Department (HOSPITAL_COMMUNITY)
Admission: EM | Admit: 2016-01-13 | Discharge: 2016-01-13 | Disposition: A | Discharge status: Home / Self Care | Payer: BLUE CROSS/BLUE SHIELD | Attending: Emergency Medicine | Admitting: Emergency Medicine

## 2016-01-13 DIAGNOSIS — Y92410 Unspecified street and highway as the place of occurrence of the external cause: Secondary | ICD-10-CM | POA: Insufficient documentation

## 2016-01-13 DIAGNOSIS — Y939 Activity, unspecified: Secondary | ICD-10-CM | POA: Insufficient documentation

## 2016-01-13 DIAGNOSIS — Z79899 Other long term (current) drug therapy: Secondary | ICD-10-CM | POA: Insufficient documentation

## 2016-01-13 DIAGNOSIS — M542 Cervicalgia: Secondary | ICD-10-CM

## 2016-01-13 DIAGNOSIS — M25511 Pain in right shoulder: Secondary | ICD-10-CM | POA: Diagnosis not present

## 2016-01-13 DIAGNOSIS — Y999 Unspecified external cause status: Secondary | ICD-10-CM | POA: Insufficient documentation

## 2016-01-13 DIAGNOSIS — M5441 Lumbago with sciatica, right side: Secondary | ICD-10-CM | POA: Diagnosis not present

## 2016-01-13 DIAGNOSIS — S3992XA Unspecified injury of lower back, initial encounter: Secondary | ICD-10-CM | POA: Diagnosis not present

## 2016-01-13 DIAGNOSIS — S4991XA Unspecified injury of right shoulder and upper arm, initial encounter: Secondary | ICD-10-CM | POA: Diagnosis not present

## 2016-01-13 DIAGNOSIS — M545 Low back pain: Secondary | ICD-10-CM | POA: Diagnosis not present

## 2016-01-13 DIAGNOSIS — M5442 Lumbago with sciatica, left side: Secondary | ICD-10-CM | POA: Diagnosis not present

## 2016-01-13 DIAGNOSIS — Z87891 Personal history of nicotine dependence: Secondary | ICD-10-CM | POA: Diagnosis not present

## 2016-01-13 DIAGNOSIS — S199XXA Unspecified injury of neck, initial encounter: Secondary | ICD-10-CM | POA: Diagnosis not present

## 2016-01-13 MED ORDER — NAPROXEN 250 MG PO TABS: 250.0000 mg | ORAL_TABLET | Freq: Two times a day (BID) | ORAL | Status: DC

## 2016-01-13 NOTE — ED Notes (Signed)
Taken to CT via wheelchair.
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
## 2016-01-24 DIAGNOSIS — M542 Cervicalgia: Secondary | ICD-10-CM | POA: Diagnosis not present

## 2016-02-01 ENCOUNTER — Ambulatory Visit: Payer: BLUE CROSS/BLUE SHIELD | Admitting: Family Medicine

## 2016-02-15 ENCOUNTER — Ambulatory Visit: Payer: BLUE CROSS/BLUE SHIELD | Admitting: Family Medicine

## 2016-02-18 DIAGNOSIS — F419 Anxiety disorder, unspecified: Secondary | ICD-10-CM | POA: Diagnosis not present

## 2016-08-18 DIAGNOSIS — M778 Other enthesopathies, not elsewhere classified: Secondary | ICD-10-CM | POA: Diagnosis not present

## 2017-03-15 DIAGNOSIS — C4441 Basal cell carcinoma of skin of scalp and neck: Secondary | ICD-10-CM | POA: Diagnosis not present

## 2017-03-15 DIAGNOSIS — C44519 Basal cell carcinoma of skin of other part of trunk: Secondary | ICD-10-CM | POA: Diagnosis not present

## 2017-03-27 DIAGNOSIS — L729 Follicular cyst of the skin and subcutaneous tissue, unspecified: Secondary | ICD-10-CM | POA: Diagnosis not present

## 2017-03-27 DIAGNOSIS — F419 Anxiety disorder, unspecified: Secondary | ICD-10-CM | POA: Diagnosis not present
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
## 2017-12-28 DIAGNOSIS — M19042 Primary osteoarthritis, left hand: Secondary | ICD-10-CM | POA: Diagnosis not present

## 2017-12-28 DIAGNOSIS — M19041 Primary osteoarthritis, right hand: Secondary | ICD-10-CM | POA: Diagnosis not present

## 2018-02-05 DIAGNOSIS — Z8371 Family history of colonic polyps: Secondary | ICD-10-CM | POA: Diagnosis not present

## 2018-02-13 DIAGNOSIS — Z1211 Encounter for screening for malignant neoplasm of colon: Secondary | ICD-10-CM | POA: Diagnosis not present

## 2018-02-13 DIAGNOSIS — D126 Benign neoplasm of colon, unspecified: Secondary | ICD-10-CM | POA: Diagnosis not present

## 2018-02-15 DIAGNOSIS — D126 Benign neoplasm of colon, unspecified: Secondary | ICD-10-CM | POA: Diagnosis not present

## 2018-04-08 DIAGNOSIS — F419 Anxiety disorder, unspecified: Secondary | ICD-10-CM | POA: Diagnosis not present

## 2018-06-03 DIAGNOSIS — C4441 Basal cell carcinoma of skin of scalp and neck: Secondary | ICD-10-CM | POA: Diagnosis not present

## 2018-06-03 DIAGNOSIS — Z85828 Personal history of other malignant neoplasm of skin: Secondary | ICD-10-CM | POA: Diagnosis not present

## 2018-06-03 DIAGNOSIS — C44519 Basal cell carcinoma of skin of other part of trunk: Secondary | ICD-10-CM | POA: Diagnosis not present

## 2018-06-03 DIAGNOSIS — L821 Other seborrheic keratosis: Secondary | ICD-10-CM | POA: Diagnosis not present

## 2018-06-03 DIAGNOSIS — L812 Freckles: Secondary | ICD-10-CM | POA: Diagnosis not present

## 2018-07-04 DIAGNOSIS — C4441 Basal cell carcinoma of skin of scalp and neck: Secondary | ICD-10-CM | POA: Diagnosis not present

## 2018-10-09 DIAGNOSIS — J209 Acute bronchitis, unspecified: Secondary | ICD-10-CM | POA: Diagnosis not present

## 2018-10-09 DIAGNOSIS — F17210 Nicotine dependence, cigarettes, uncomplicated: Secondary | ICD-10-CM | POA: Diagnosis not present

## 2018-10-28 DIAGNOSIS — F17210 Nicotine dependence, cigarettes, uncomplicated: Secondary | ICD-10-CM | POA: Diagnosis not present

## 2018-10-28 DIAGNOSIS — F419 Anxiety disorder, unspecified: Secondary | ICD-10-CM | POA: Diagnosis not present

## 2019-04-14 DIAGNOSIS — F17210 Nicotine dependence, cigarettes, uncomplicated: Secondary | ICD-10-CM | POA: Diagnosis not present

## 2019-04-14 DIAGNOSIS — D72829 Elevated white blood cell count, unspecified: Secondary | ICD-10-CM | POA: Diagnosis not present

## 2019-04-14 DIAGNOSIS — Z125 Encounter for screening for malignant neoplasm of prostate: Secondary | ICD-10-CM | POA: Diagnosis not present

## 2019-04-14 DIAGNOSIS — F419 Anxiety disorder, unspecified: Secondary | ICD-10-CM | POA: Diagnosis not present

## 2019-04-14 DIAGNOSIS — N390 Urinary tract infection, site not specified: Secondary | ICD-10-CM | POA: Diagnosis not present

## 2019-04-14 DIAGNOSIS — Z13220 Encounter for screening for lipoid disorders: Secondary | ICD-10-CM | POA: Diagnosis not present

## 2019-04-14 DIAGNOSIS — Z0000 Encounter for general adult medical examination without abnormal findings: Secondary | ICD-10-CM | POA: Diagnosis not present

## 2019-10-28 DIAGNOSIS — C4441 Basal cell carcinoma of skin of scalp and neck: Secondary | ICD-10-CM | POA: Diagnosis not present

## 2020-01-16 DIAGNOSIS — R079 Chest pain, unspecified: Secondary | ICD-10-CM | POA: Diagnosis not present

## 2020-01-16 DIAGNOSIS — R0602 Shortness of breath: Secondary | ICD-10-CM | POA: Diagnosis not present

## 2020-01-16 DIAGNOSIS — F17210 Nicotine dependence, cigarettes, uncomplicated: Secondary | ICD-10-CM | POA: Diagnosis not present

## 2020-01-16 DIAGNOSIS — F419 Anxiety disorder, unspecified: Secondary | ICD-10-CM | POA: Diagnosis not present

## 2020-04-15 DIAGNOSIS — M5410 Radiculopathy, site unspecified: Secondary | ICD-10-CM | POA: Diagnosis not present

## 2020-04-15 DIAGNOSIS — M5137 Other intervertebral disc degeneration, lumbosacral region: Secondary | ICD-10-CM | POA: Diagnosis not present

## 2020-09-10 DIAGNOSIS — F419 Anxiety disorder, unspecified: Secondary | ICD-10-CM | POA: Diagnosis not present

## 2020-09-10 DIAGNOSIS — F17210 Nicotine dependence, cigarettes, uncomplicated: Secondary | ICD-10-CM | POA: Diagnosis not present

## 2020-11-19 DIAGNOSIS — F17210 Nicotine dependence, cigarettes, uncomplicated: Secondary | ICD-10-CM | POA: Diagnosis not present

## 2020-11-19 DIAGNOSIS — M5416 Radiculopathy, lumbar region: Secondary | ICD-10-CM | POA: Diagnosis not present

## 2020-11-19 DIAGNOSIS — M545 Low back pain, unspecified: Secondary | ICD-10-CM | POA: Diagnosis not present

## 2020-11-19 DIAGNOSIS — R062 Wheezing: Secondary | ICD-10-CM | POA: Diagnosis not present

## 2020-11-26 ENCOUNTER — Other Ambulatory Visit: Payer: Self-pay | Admitting: Family Medicine

## 2020-11-26 DIAGNOSIS — F17210 Nicotine dependence, cigarettes, uncomplicated: Secondary | ICD-10-CM

## 2020-11-26 DIAGNOSIS — M519 Unspecified thoracic, thoracolumbar and lumbosacral intervertebral disc disorder: Secondary | ICD-10-CM | POA: Diagnosis not present

## 2020-11-26 DIAGNOSIS — M5136 Other intervertebral disc degeneration, lumbar region: Secondary | ICD-10-CM | POA: Diagnosis not present

## 2020-12-10 ENCOUNTER — Ambulatory Visit
Admission: RE | Admit: 2020-12-10 | Discharge: 2020-12-10 | Disposition: A | Discharge status: Home / Self Care | Payer: BC Managed Care – PPO | Source: Ambulatory Visit | Attending: Family Medicine | Admitting: Family Medicine

## 2020-12-10 ENCOUNTER — Other Ambulatory Visit: Payer: Self-pay

## 2020-12-10 DIAGNOSIS — F17210 Nicotine dependence, cigarettes, uncomplicated: Secondary | ICD-10-CM | POA: Diagnosis not present

## 2021-06-27 DIAGNOSIS — Z85828 Personal history of other malignant neoplasm of skin: Secondary | ICD-10-CM | POA: Diagnosis not present

## 2021-06-27 DIAGNOSIS — D485 Neoplasm of uncertain behavior of skin: Secondary | ICD-10-CM | POA: Diagnosis not present

## 2021-07-04 DIAGNOSIS — F17210 Nicotine dependence, cigarettes, uncomplicated: Secondary | ICD-10-CM | POA: Diagnosis not present

## 2021-07-04 DIAGNOSIS — D487 Neoplasm of uncertain behavior of other specified sites: Secondary | ICD-10-CM | POA: Diagnosis not present

## 2021-07-04 DIAGNOSIS — R590 Localized enlarged lymph nodes: Secondary | ICD-10-CM | POA: Diagnosis not present

## 2021-07-06 ENCOUNTER — Other Ambulatory Visit: Payer: Self-pay | Admitting: Otolaryngology

## 2021-07-06 DIAGNOSIS — R221 Localized swelling, mass and lump, neck: Secondary | ICD-10-CM

## 2021-07-27 ENCOUNTER — Ambulatory Visit
Admission: RE | Admit: 2021-07-27 | Discharge: 2021-07-27 | Disposition: A | Discharge status: Home / Self Care | Payer: BC Managed Care – PPO | Source: Ambulatory Visit | Attending: Otolaryngology | Admitting: Otolaryngology

## 2021-07-27 DIAGNOSIS — M542 Cervicalgia: Secondary | ICD-10-CM | POA: Diagnosis not present

## 2021-07-27 DIAGNOSIS — Z85828 Personal history of other malignant neoplasm of skin: Secondary | ICD-10-CM | POA: Diagnosis not present

## 2021-07-27 DIAGNOSIS — R221 Localized swelling, mass and lump, neck: Secondary | ICD-10-CM

## 2021-07-27 MED ORDER — IOPAMIDOL (ISOVUE-300) INJECTION 61%
75.0000 mL | Freq: Once | INTRAVENOUS | Status: AC | PRN
  Administered 2021-07-27: 75 mL via INTRAVENOUS

## 2021-08-16 DIAGNOSIS — D487 Neoplasm of uncertain behavior of other specified sites: Secondary | ICD-10-CM | POA: Diagnosis not present

## 2021-08-16 DIAGNOSIS — R070 Pain in throat: Secondary | ICD-10-CM | POA: Diagnosis not present

## 2021-08-16 DIAGNOSIS — C4442 Squamous cell carcinoma of skin of scalp and neck: Secondary | ICD-10-CM | POA: Diagnosis not present

## 2021-08-22 ENCOUNTER — Other Ambulatory Visit: Payer: Self-pay | Admitting: Otolaryngology

## 2021-08-22 ENCOUNTER — Other Ambulatory Visit (HOSPITAL_COMMUNITY): Payer: Self-pay | Admitting: Otolaryngology

## 2021-08-22 DIAGNOSIS — C760 Malignant neoplasm of head, face and neck: Secondary | ICD-10-CM

## 2021-08-23 ENCOUNTER — Other Ambulatory Visit: Payer: Self-pay

## 2021-08-23 DIAGNOSIS — C760 Malignant neoplasm of head, face and neck: Secondary | ICD-10-CM

## 2021-08-23 NOTE — Progress Notes (Signed)
Oncology Nurse Navigator Documentation   At Dr. Pearlie Oyster request I called [facility] and requested that HPV testing be added to Richard Terry FNA completed by Dr. Benjamine Mola on 08/16/21. The person I spoke with verified understanding of the request and told me that she would send request to be completed.  Harlow Asa RN, BSN, OCN Head & Neck Oncology Nurse [place] at [facility] Phone # [phone]  Fax # [phone]

## 2021-08-24 ENCOUNTER — Telehealth: Payer: Self-pay | Admitting: Hematology and Oncology

## 2021-08-24 ENCOUNTER — Encounter: Payer: Self-pay | Admitting: Radiation Oncology

## 2021-08-24 NOTE — Telephone Encounter (Signed)
Scheduled appt per 2/6 referral. Pt's wife is aware of appt date and time. Pt's wife is aware to arrive 15 mins prior to appt time.

## 2021-08-24 NOTE — Progress Notes (Signed)
Oncology Nurse Navigator Documentation   Placed introductory call to new referral patient Sarp Vernier. I spoke with his wife, Malachy Mood.  Introduced myself as the H&N oncology nurse navigator that works with Dr. Isidore Moos and Dr. Chryl Heck to whom he has been referred by Dr. Benjamine Mola. She confirmed understanding of referral. Briefly explained my role as his navigator, provided my contact information.  Confirmed understanding of upcoming appts and [place] location, explained arrival and registration process. I encouraged them to call with questions/concerns as he moves forward with appts and procedures.   She verbalized understanding of information provided, expressed appreciation for my call.   Navigator Initial Assessment Employment Status: he is working  Currently on [employer] / STD: no Living Situation: he lives with his wife.  Support System: wife, family PCP: none PCD: Financial Concerns:not at this time.  Transportation Needs: no Sensory Deficits: Engineer, building services Needed:  no Ambulation Needs: no DME Used in Home: no Psychosocial Needs:  no Concerns/Needs Understanding Cancer:  addressed/answered by navigator to best of ability Self-Expressed Needs: no   Harlow Asa RN, BSN, OCN Head & Neck Oncology Nurse [place] at [facility] Phone # [phone]  Fax # [phone]

## 2021-08-29 ENCOUNTER — Other Ambulatory Visit: Payer: Self-pay

## 2021-08-29 ENCOUNTER — Ambulatory Visit (HOSPITAL_COMMUNITY)
Admission: RE | Admit: 2021-08-29 | Discharge: 2021-08-29 | Disposition: A | Discharge status: Home / Self Care | Payer: BC Managed Care – PPO | Source: Ambulatory Visit | Attending: Otolaryngology | Admitting: Otolaryngology

## 2021-08-29 DIAGNOSIS — C760 Malignant neoplasm of head, face and neck: Secondary | ICD-10-CM | POA: Diagnosis not present

## 2021-08-29 DIAGNOSIS — J432 Centrilobular emphysema: Secondary | ICD-10-CM | POA: Diagnosis not present

## 2021-08-29 DIAGNOSIS — I700 Atherosclerosis of aorta: Secondary | ICD-10-CM | POA: Diagnosis not present

## 2021-08-29 DIAGNOSIS — R221 Localized swelling, mass and lump, neck: Secondary | ICD-10-CM | POA: Diagnosis not present

## 2021-08-29 LAB — GLUCOSE, CAPILLARY: Glucose-Capillary: 105 mg/dL — ABNORMAL HIGH (ref 70–99)

## 2021-08-29 MED ORDER — FLUDEOXYGLUCOSE F - 18 (FDG) INJECTION
8.5000 | Freq: Once | INTRAVENOUS | Status: AC | PRN
  Administered 2021-08-29: 8.49 via INTRAVENOUS

## 2021-08-30 NOTE — Progress Notes (Signed)
Radiation Oncology         [phone] ________________________________  Initial Outpatient Consultation  Name: Richard Terry MRN: [id number]  Date: 08/31/2021  DOB: 21-Feb-1961  CC:Pcp, No  Leta Baptist, MD   REFERRING PHYSICIAN: Leta Baptist, MD  DIAGNOSIS: C77.0    ICD-10-CM   1. Head and neck cancer (HCC)  C76.0 nicotine (NICODERM CQ - DOSED IN MG/24 HOURS) 21 mg/24hr patch    nicotine (NICODERM CQ - DOSED IN MG/24 HOURS) 14 mg/24hr patch    nicotine (NICODERM CQ - DOSED IN MG/24 HR) 7 mg/24hr patch    2. 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 male who presented to Dr. Benjamine Mola on 07/04/21 for evaluation of a left neck mass. The patient first noticed the left neck mass one year prior, and reported that the mass gradually increased in size over time. The patient also has a history significant for basal cell carcinoma on his neck and back, for which he has undergone multiple surgical excisions at [facility]. Physical exam performed by Dr. Benjamine Mola during this visit revealed the current neck mass to measure 2 cm, non-mobile, and located anteriorly to a previous excision site (left level 5 neck). The rest of his ENT exam performed revealed normal findings.   Soft tissue neck CT ordered by Dr. Benjamine Mola on 07/27/21 for further evaluation surprisingly revealed no acute abnormality in the neck to account for the patient's symptoms (including no abnormal soft tissue masses, fluid collections, or pathologic lymphadenopathy). However, on further review at tumor board today, there is a 2cm level 5 neck node appreciated without significant enhancement.  FNA of the left neck mass on 08/16/21 revealed malignant cells consistent  with squamous cell carcinoma.   Pertinent imaging this far includes:  --PET on 08/29/21 demonstrated an ill-defined hypermetabolic 16 x 13 mm left neck mass located between the sternocleidomastoid and trapezius muscles, likely corresponding with the biopsy-proven neoplasm. Otherwise, no evidence of hypermetabolic metastatic disease was appreciated within the neck, chest, abdomen, or pelvis.  He smokes 3/4ppd and drinks ETOH, has been heavy drinker at times. Leaving for cruise on 2/17 with family - gone for 1.5 wks. 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 Has had multiple basal cell carcinomas of the neck and chest. No known squamous cell skin cancers nor facial or scalp cancers in the past.   Nutrition Status Yes No Comments  Weight changes? [x]  []  Mild unintentional weight loss due to decrease in appetite  Swallowing concerns? []  [x]    PEG? []  [x]     Referrals Yes No Comments  Social Work? [x]  []    Dentistry? []  [x]    Swallowing therapy? [x]  []    Nutrition? [x]  []    Med/Onc? [x]  []     Safety Issues Yes No Comments  Prior radiation? []  [x]    Pacemaker/ICD? []  [x]    Possible current pregnancy? []  [x]  N/A  Is the patient on methotrexate? []  [x]     PREVIOUS RADIATION THERAPY: No  PAST MEDICAL HISTORY:  has a past medical history of Anxiety, Arthritis, Cough, and Right eye injury.    PAST SURGICAL HISTORY: Past Surgical History:  Procedure Laterality Date   ANTERIOR CERVICAL DECOMP/DISCECTOMY FUSION N/A  10/22/2012   Procedure: ANTERIOR CERVICAL DECOMPRESSION/DISCECTOMY FUSION 3 LEVELS;  Surgeon: Otilio Connors, MD;  Location: [facility];  Service: Neurosurgery;  Laterality: N/A;  Cervical four-five,five-six,six-seven Anterior cervical decompression/diskectomy/fusion/allograft/trestle plate   EYE SURGERY     SHOULDER SURGERY      FAMILY HISTORY: family history includes Bladder Cancer in his mother. Family history noted by Dr. Benjamine Mola includes heart disease, stroke, and stomach  cancer (family members not specified).   SOCIAL HISTORY:  reports that he has been smoking cigarettes. He has been smoking an average of .75 packs per day. He has never used smokeless tobacco. He reports current alcohol use. He reports that he does not use drugs.  ALLERGIES: Bupropion  MEDICATIONS:  Current Outpatient Medications  Medication Sig Dispense Refill   nicotine (NICODERM CQ - DOSED IN MG/24 HOURS) 14 mg/24hr patch Place 1 patch (14 mg total) onto the skin daily. Apply 21 mg patch daily x 6 wk, then 14mg  patch daily x 2 wk, then 7 mg patch daily x 2 wk 14 patch 0   nicotine (NICODERM CQ - DOSED IN MG/24 HOURS) 21 mg/24hr patch Place 1 patch (21 mg total) onto the skin daily. Apply 21 mg patch daily x 6 wk, then 14mg  patch daily x 2 wk, then 7 mg patch daily x 2 wk 14 patch 2   nicotine (NICODERM CQ - DOSED IN MG/24 HR) 7 mg/24hr patch Place 1 patch (7 mg total) onto the skin daily. Apply 21 mg patch daily x 6 wk, then 14mg  patch daily x 2 wk, then 7 mg patch daily x 2 wk 14 patch 0   albuterol (VENTOLIN HFA) 108 (90 Base) MCG/ACT inhaler Inhale 1 puff into the lungs daily as needed.     Ca Carbonate-Mag Hydroxide (ROLAIDS PO) Take 1-2 tablets by mouth at bedtime as needed (heart burn).     cyclobenzaprine (FLEXERIL) 10 MG tablet Take 1 tablet (10 mg total) by mouth 3 (three) times daily as needed for muscle spasms. 50 tablet 1   escitalopram (LEXAPRO) 20 MG tablet Take 20 mg by mouth daily.     HYDROcodone-acetaminophen (NORCO/VICODIN) 5-325 MG per tablet Take 1 tablet by mouth every 6 (six) hours as needed for moderate pain. 10 tablet 0   naproxen (NAPROSYN) 250 MG tablet Take 1 tablet (250 mg total) by mouth 2 (two) times daily with a meal. 30 tablet 0   oxyCODONE-acetaminophen (PERCOCET/ROXICET) 5-325 MG per tablet Take 1-2 tablets by mouth every 4 (four) hours as needed for pain. 51 tablet 0   sertraline (ZOLOFT) 100 MG tablet Take 150 mg by mouth daily.     No current  facility-administered medications for this encounter.    REVIEW OF SYSTEMS:  Notable for that above.   PHYSICAL EXAM:  height is 6\' 2"  (1.88 m) and weight is 151 lb 8 oz (68.7 kg). His oral temperature is 98.6 F (37 C). His blood pressure is 132/77 and his pulse is 88. His respiration is 18 and oxygen saturation is 99%.   General: Alert and oriented, in no acute distress  HEENT: Head is normocephalic. Extraocular movements are intact. Oropharynx is clear. Neck: left level 5 neck mass (posterior neck) is about 2cm with adjacent scar from previous skin excision. Otherwise, periauricular regions and neck are supple, no palpable cervical or supraclavicular lymphadenopathy. Heart: Regular in rate and rhythm with no murmurs, rubs, or gallops. Chest: Clear to auscultation bilaterally, with no rhonchi, wheezes, or rales. Abdomen: Soft, nontender, nondistended,  with no rigidity or guarding. Extremities: No cyanosis or edema. Lymphatics: see Neck Exam Skin: evidence of chronic sun exposure. No obvious neoplasm on head,scalp,neck at this time Musculoskeletal: symmetric strength and muscle tone throughout. Neurologic: Cranial nerves II through XII are grossly intact. No obvious focalities. Speech is fluent. Coordination is intact. Psychiatric: Judgment and insight are intact. Affect is appropriate.   ECOG = 0  0 - Asymptomatic (Fully active, able to carry on all predisease activities without restriction)  1 - Symptomatic but completely ambulatory (Restricted in physically strenuous activity but ambulatory and able to carry out work of a light or sedentary nature. For example, light housework, office work)  2 - Symptomatic, <50% in bed during the day (Ambulatory and capable of all self care but unable to carry out any work activities. Up and about more than 50% of waking hours)  3 - Symptomatic, >50% in bed, but not bedbound (Capable of only limited self-care, confined to bed or chair 50% or more of  waking hours)  4 - Bedbound (Completely disabled. Cannot carry on any self-care. Totally confined to bed or chair)  5 - Death   Eustace Pen MM, Creech RH, Tormey DC, et al. [id number]). "Toxicity and response criteria of the [facility]". [place] Oncol. 5 (6): 649-55   LABORATORY DATA:  Lab Results  Component Value Date   WBC 9.7 10/21/2012   HGB 15.4 10/21/2012   HCT 42.8 10/21/2012   MCV 91.3 10/21/2012   PLT 339 10/21/2012   CMP     Component Value Date/Time   NA 136 10/21/2012 1133   K 4.3 10/21/2012 1133   CL 101 10/21/2012 1133   CO2 24 10/21/2012 1133   GLUCOSE 134 (H) 10/21/2012 1133   BUN 13 10/21/2012 1133   CREATININE 0.87 10/21/2012 1133   CALCIUM 9.6 10/21/2012 1133   GFRNONAA >90 10/21/2012 1133   GFRAA >90 10/21/2012 1133         RADIOGRAPHY: NM PET Image Initial (PI) Skull Base To Thigh (F-18 FDG)  Result Date: 08/30/2021 CLINICAL DATA:  Initial treatment strategy for neck cancer. EXAM: NUCLEAR MEDICINE PET SKULL BASE TO THIGH TECHNIQUE: 8.49 mCi F-18 FDG was injected intravenously. Full-ring PET imaging was performed from the skull base to thigh after the radiotracer. CT data was obtained and used for attenuation correction and anatomic localization. Fasting blood glucose: 105 mg/dl COMPARISON:  CT July 27, 2021 and Dec 10, 2020. FINDINGS: Mediastinal blood pool activity: SUV max 2.53 Liver activity: SUV max NA NECK: Ill-defined hypermetabolic left neck mass which is hypodense to skeletal muscle, measuring approximally 16 x 13 mm on image 46/8 with a max SUV of 12.72 and located between the sternocleidomastoid and trapezius muscles. No hypermetabolic cervical lymph nodes. Incidental CT findings: none CHEST: No hypermetabolic mediastinal or hilar adenopathy. No hypermetabolic pulmonary nodules or masses. Incidental CT findings: Scattered small pulmonary nodules are again noted measuring up to 5 mm in the peripheral anterior right lower lobe on  image 63/8, unchanged from prior. Mild centrilobular and paraseptal emphysema. Normal size heart. ABDOMEN/PELVIS: No abnormal hypermetabolic activity within the liver, pancreas, adrenal glands, or spleen. No hypermetabolic lymph nodes in the abdomen or pelvis. Incidental CT findings: Unremarkable noncontrast appearance of the hepatic, pancreatic and splenic parenchyma. No hydronephrosis. No bowel obstruction. Aortic atherosclerosis. Prostatic calcifications. SKELETON: No focal hypermetabolic activity to suggest skeletal metastasis. Incidental CT findings: Cervical fusion hardware. Multilevel degenerative changes spine. IMPRESSION: 1. Ill-defined hypermetabolic 16 x 13 mm left  neck mass located between the sternocleidomastoid and trapezius muscles likely corresponds with the biopsy-proven neoplasm. 2. No evidence of hypermetabolic metastatic disease in the neck, chest, abdomen or pelvis. Electronically Signed   By: Dahlia Bailiff M.D.   On: 08/30/2021 11:43      IMPRESSION/PLAN: left level 5 neck mass - SCC - skin or throat primary  Today, I talked to the patient and his wife about the findings and work-up thus far.  We discussed the patient's diagnosis and general treatment for this.  I recommend he first see Dr. Constance Holster (as discussed today with Dr Benjamine Mola at tumor board, who agrees) to be considered for Left neck dissection and targeted biopsies of mucosal axis of throat. Given smoking/ETOH history, throat cancer primary is a possibly, but I believe more likely this is a skin primary. Dr. Constance Holster will also inspect his skin and biopsy skin as needed.  We talked about the the role of adjuvant radiotherapy in the management of his disease.  We discussed the available radiation techniques, and focused on the details of logistics and delivery.   It is possible he will need adjuvant RT for cure. If throat primary is revealed, we may treat the oropharynx and bilateral neck. If skin cancer primary is felt to be more  likely, we may treat the left neck alone. If only one node is positive and it is under 3cm with no extracapsular extension nor any suspicious cells in the throat, observation is a possibility after surgery (though radiation therapy to left neck for prophylaxis is still appropriate for patients desiring more aggressive treatment).  We discussed the risks, benefits, and side effects of radiotherapy. Side effects may include but not necessarily be limited to: skin irritation, fatigue, mucositis, taste changes, hair loss, lymphedema, injury to tissues of the head and neck such as nerves, vessels, spinal cord, thyroid gland, salivary tissue, jaw. No guarantees of treatment were given. A consent form was signed and placed in the patient's medical record. The patient was encouraged to ask questions that I answered to the best of my ability.    I will await his referral back from Dr Constance Holster as appropriate.  Pt will see him after his cruise.  I asked the patient today about tobacco use. The patient uses tobacco.  I advised the patient to quit. Services were offered by me today including outpatient counseling and pharmacotherapy. I assessed for the willingness to attempt to quit and provided encouragement and demonstrated willingness to make referrals and/or prescriptions to help the patient attempt to quit. The patient has follow-up with the oncologic team to touch base on their tobacco use and /or cessation efforts.  Over 3 minutes were spent on this issue. He accepted Rx for nicotine patches today. Quite date - 09/22/21.   On date of service, in total, I spent 60 minutes on this encounter. Patient was seen in person.   __________________________________________   Eppie Gibson, MD  This document serves as a record of services personally performed by Eppie Gibson, MD. It was created on her behalf by Roney Mans, a trained medical scribe. The creation of this record is based on the scribe's personal  observations and the provider's statements to them. This document has been checked and approved by the attending provider.

## 2021-08-30 NOTE — Progress Notes (Signed)
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 No evidence of hypermetabolic metastatic disease in the neck, chest, abdomen or pelvis.  Biopsies revealed:  08/16/2021 Fine needle aspiration    Nutrition Status Yes No Comments  Weight changes? [x]  []  Mild unintentional weight loss due to decrease in appetite  Swallowing concerns? []  [x]    PEG? []  [x]     Referrals Yes No Comments  Social Work? [x]  []    Dentistry? []  [x]    Swallowing therapy? [x]  []    Nutrition? [x]  []    Med/Onc? [x]  []     Safety Issues Yes No Comments  Prior radiation? []  [x]    Pacemaker/ICD? []  [x]    Possible current pregnancy? []  [x]  N/A  Is the patient on methotrexate? []  [x]     Tobacco/Marijuana/Snuff/ETOH use: Smokes 0.5-1 pack/day but denies any smokeless tobacco use. Occasionally drinks alcohol. Denies any recreational drug use  Past/Anticipated interventions by otolaryngology, if any:  08/16/2021 --Dr. Leta Baptist Fine needle aspiration   Past/Anticipated interventions by medical oncology, if any:  Scheduled for consultation with Dr. Benay Pike on 09/16/2021   Current Complaints / other details:  Leaving for a cruise with his family on Friday 09/02/21

## 2021-08-31 ENCOUNTER — Ambulatory Visit
Admission: RE | Admit: 2021-08-31 | Discharge: 2021-08-31 | Disposition: A | Discharge status: Home / Self Care | Payer: BC Managed Care – PPO | Source: Ambulatory Visit | Attending: Radiation Oncology | Admitting: Radiation Oncology

## 2021-08-31 ENCOUNTER — Other Ambulatory Visit: Payer: Self-pay

## 2021-08-31 ENCOUNTER — Ambulatory Visit: Payer: BC Managed Care – PPO

## 2021-08-31 ENCOUNTER — Ambulatory Visit: Payer: BC Managed Care – PPO | Admitting: Radiation Oncology

## 2021-08-31 ENCOUNTER — Encounter: Payer: Self-pay | Admitting: Radiation Oncology

## 2021-08-31 VITALS — BP 132/77 | HR 88 | Temp 98.6°F | Resp 18 | Ht 74.0 in | Wt 151.5 lb

## 2021-08-31 DIAGNOSIS — F17210 Nicotine dependence, cigarettes, uncomplicated: Secondary | ICD-10-CM | POA: Insufficient documentation

## 2021-08-31 DIAGNOSIS — Z800 Family history of malignant neoplasm of digestive organs: Secondary | ICD-10-CM | POA: Diagnosis not present

## 2021-08-31 DIAGNOSIS — C770 Secondary and unspecified malignant neoplasm of lymph nodes of head, face and neck: Secondary | ICD-10-CM

## 2021-08-31 DIAGNOSIS — F419 Anxiety disorder, unspecified: Secondary | ICD-10-CM | POA: Diagnosis not present

## 2021-08-31 DIAGNOSIS — C760 Malignant neoplasm of head, face and neck: Secondary | ICD-10-CM | POA: Diagnosis not present

## 2021-08-31 DIAGNOSIS — I700 Atherosclerosis of aorta: Secondary | ICD-10-CM | POA: Insufficient documentation

## 2021-08-31 DIAGNOSIS — J432 Centrilobular emphysema: Secondary | ICD-10-CM | POA: Insufficient documentation

## 2021-08-31 DIAGNOSIS — Z79899 Other long term (current) drug therapy: Secondary | ICD-10-CM | POA: Diagnosis not present

## 2021-08-31 DIAGNOSIS — C4442 Squamous cell carcinoma of skin of scalp and neck: Secondary | ICD-10-CM

## 2021-08-31 MED ORDER — NICOTINE 14 MG/24HR TD PT24: 14.0000 mg | MEDICATED_PATCH | Freq: Every day | TRANSDERMAL | 0 refills | Status: DC

## 2021-08-31 MED ORDER — NICOTINE 7 MG/24HR TD PT24: 7.0000 mg | MEDICATED_PATCH | Freq: Every day | TRANSDERMAL | 0 refills | Status: DC

## 2021-08-31 MED ORDER — NICOTINE 21 MG/24HR TD PT24: 21.0000 mg | MEDICATED_PATCH | Freq: Every day | TRANSDERMAL | 2 refills | Status: DC

## 2021-08-31 NOTE — Progress Notes (Signed)
Oncology Nurse Navigator Documentation   Met with patient during initial consult with Dr. Isidore Moos. He was accompanied by his wife.  Further introduced myself as his/their Navigator, explained my role as a member of the Care Team. Assisted with post-consult appt scheduling. They verbalized understanding of information provided. I encouraged them to call with questions/concerns moving forward.  Harlow Asa, RN, BSN, OCN Head & Neck Oncology Nurse [place] [facility] [place] [phone]

## 2021-09-02 ENCOUNTER — Encounter: Payer: Self-pay | Admitting: Radiation Oncology

## 2021-09-02 DIAGNOSIS — C770 Secondary and unspecified malignant neoplasm of lymph nodes of head, face and neck: Secondary | ICD-10-CM | POA: Insufficient documentation

## 2021-09-13 DIAGNOSIS — C801 Malignant (primary) neoplasm, unspecified: Secondary | ICD-10-CM | POA: Diagnosis not present

## 2021-09-13 DIAGNOSIS — C7989 Secondary malignant neoplasm of other specified sites: Secondary | ICD-10-CM | POA: Diagnosis not present

## 2021-09-16 ENCOUNTER — Other Ambulatory Visit: Payer: BC Managed Care – PPO

## 2021-09-16 ENCOUNTER — Ambulatory Visit: Payer: BC Managed Care – PPO | Admitting: Hematology and Oncology

## 2021-09-23 NOTE — H&P (Signed)
HPI:  ? ?Richard Terry is a 61 y.o. male who presents as a consult Patient.  ? ?Referring Provider: Self, A Referral ? ?Chief complaint: Neck mass. ? ?HPI: History of neck mass, biopsy-proven carcinoma suspicious for squamous cell. PET scan imaging revealed no source or primary site. No other pathology identified. Here for consultation for neck dissection. History of skin cancers but usually basal cells. ? ?PMH/Meds/All/SocHx/FamHx/ROS:  ? ?History reviewed. No pertinent past medical history. ? ?History reviewed. No pertinent surgical history. ? ?No family history of bleeding disorders, wound healing problems or difficulty with anesthesia.  ? ?Social History  ? ?Socioeconomic History  ? Marital status: Married  ?Spouse name: Not on file  ? Number of children: Not on file  ? Years of education: Not on file  ? Highest education level: Not on file  ?Occupational History  ? Not on file  ?Tobacco Use  ? Smoking status: Every Day  ?Types: Cigarettes  ? Smokeless tobacco: Current  ?Substance and Sexual Activity  ? Alcohol use: Yes  ? Drug use: Not on file  ? Sexual activity: Not on file  ?Other Topics Concern  ? Not on file  ?Social History Narrative  ? Not on file  ? ?Social Determinants of Health  ? ?Financial Resource Strain: Not on file  ?Food Insecurity: Not on file  ?Transportation Needs: Not on file  ?Physical Activity: Not on file  ?Stress: Not on file  ?Social Connections: Not on file  ?Housing Stability: Not on file  ? ?Current Outpatient Medications:  ? escitalopram oxalate (LEXAPRO) 20 MG tablet, 1 tablet, Disp: , Rfl:  ? nicotine (NICODERM CQ) 14 mg/24 hr, Place 14 mg onto the skin., Disp: , Rfl:  ? ?A complete ROS was performed with pertinent positives/negatives noted in the HPI. The remainder of the ROS are negative. ? ? ?Physical Exam:  ? ?Temp 97.4 ?F (36.3 ?C)  Wt 69.9 kg (154 lb)  ? ?General: Thin gentleman with severe diffuse sun damage to his skin, in no distress, breathing easily. Normal affect. In  a pleasant mood. ?Head: Normocephalic, atraumatic. No masses, or scars. ?Eyes: Pupils are equal, and reactive to light. Vision is grossly intact. No spontaneous or gaze nystagmus. ?Ears: Ear canals are clear. Tympanic membranes are intact, with normal landmarks and the middle ears are clear and healthy. ?Hearing: Grossly normal. ?Nose: Nasal cavities are clear with healthy mucosa, no polyps or exudate. Airways are patent. ?Face: No masses or scars, facial nerve function is symmetric. ?Oral Cavity: No mucosal abnormalities are noted. Tongue with normal mobility. Dentition appears healthy. ?Oropharynx: Tonsils are surgically absent. There are no mucosal masses identified. Tongue base appears normal and healthy. ?Larynx/Hypopharynx: indirect exam reveals healthy, mobile vocal cords, without mucosal lesions in the hypopharynx or larynx. ?Chest: Deferred ?Neck: Solitary inferior left posterior triangle 2 cm lymph node, otherwise no adenopathy, no thyroid nodules or enlargement. ?Neuro: Cranial nerves II-XII with normal function. ?Balance: Normal gate. ?Other findings: none. ? ?Independent Review of Additional Tests or Records:  ?PET scan: ? ?IMPRESSION:  ?1. Ill-defined hypermetabolic 16 x 13 mm left neck mass located  ?between the sternocleidomastoid and trapezius muscles likely  ?corresponds with the biopsy-proven neoplasm.  ? ?2. No evidence of hypermetabolic metastatic disease in the neck,  ?chest, abdomen or pelvis.  ? ?Procedures:  ?none ? 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 Unknown primary at this point. It could be from a skin primary that remains occult.  It could be from an upper airway mucosal primary. We will need to do upper endoscopy with multiple biopsies and left posterolateral neck dissection. Risks and benefits were discussed including possibility of shoulder dysfunction. He will need to be off work for 2 to 3 weeks. He will spend a few  days in the hospital. I have encouraged him to try to stop smoking and drinking. We discussed the risks of recurrent or additional cancers in the future. ? ?

## 2021-10-05 NOTE — Progress Notes (Signed)
Surgical Instructions ? ? ? Your procedure is scheduled on 10/10/21. ? Report to [facility] Main Entrance "A" at 5:30 A.M., then check in with the Admitting office. ? Call this number if you have problems the morning of surgery: ? [phone] ? ? If you have any questions prior to your surgery date call [phone]: Open Monday-Friday 8am-4pm ? ? ? Remember: ? Do not eat or drink after midnight the night before your surgery ? ? ?  ? Take these medicines the morning of surgery with A SIP OF WATER:  ?escitalopram (LEXAPRO) ? ?AS NEEDED: ?acetaminophen (TYLENOL) ?albuterol (VENTOLIN HFA)  ?Carboxymethylcellul-Glycerin (LUBRICATING EYE DROPS OP) ? ? ?As of today, STOP taking any Aspirin (unless otherwise instructed by your surgeon) Aleve, Naproxen, Ibuprofen, Motrin, Advil, Goody's, BC's, all herbal medications, fish oil, and all vitamins. ? ?         ?Do not wear jewelry  ?Do not wear lotions, powders, colognes, or deodorant. ?Do not shave 48 hours prior to surgery.  Men may shave face and neck. ?Do not bring valuables to the hospital. ? ? ?[place] is not responsible for any belongings or valuables. .  ? ?Do NOT Smoke (Tobacco/Vaping)  24 hours prior to your procedure ? ?If you use a CPAP at night, you may bring your mask for your overnight stay. ?  ?Contacts, glasses, hearing aids, dentures or partials may not be worn into surgery, please bring cases for these belongings ?  ?For patients admitted to the hospital, discharge time will be determined by your treatment team. ?  ?Patients discharged the day of surgery will not be allowed to drive home, and someone needs to stay with them for 24 hours. ? ? ?SURGICAL WAITING ROOM VISITATION ?Patients having surgery or a procedure in a hospital may have two support people. ?Children under the age of 36 must have an adult with them who is not the patient. ?They may stay in the waiting area during the procedure and may switch out with other visitors. If the patient needs to  stay at the hospital during part of their recovery, the visitor guidelines for inpatient rooms apply. ? ?Please refer to the [facility] website for the visitor guidelines for Inpatients (after your surgery is over and you are in a regular room).  ? ? ? ?Special instructions:   ? ?Oral Hygiene is also important to reduce your risk of infection.  Remember - BRUSH YOUR TEETH THE MORNING OF SURGERY WITH YOUR REGULAR TOOTHPASTE ? ? ?[facility]- Preparing For Surgery ? ?Before surgery, you can play an important role. Because skin is not sterile, your skin needs to be as free of germs as possible. You can reduce the number of germs on your skin by washing with CHG (chlorahexidine gluconate) Soap before surgery.  CHG is an antiseptic cleaner which kills germs and bonds with the skin to continue killing germs even after washing.   ? ? ?Please do not use if you have an allergy to CHG or antibacterial soaps. If your skin becomes reddened/irritated stop using the CHG.  ?Do not shave (including legs and underarms) for at least 48 hours prior to first CHG shower. It is OK to shave your face. ? ?Please follow these instructions carefully. ?  ? ? Shower the NIGHT BEFORE SURGERY and the MORNING OF SURGERY with CHG Soap.  ? If you chose to wash your hair, wash your hair first as usual with your normal shampoo. After you shampoo, rinse your hair and  body thoroughly to remove the shampoo.  Then [employer] and genitals (private parts) with your normal soap and rinse thoroughly to remove soap. ? ?After that Use CHG Soap as you would any other liquid soap. You can apply CHG directly to the skin and wash gently with a scrungie or a clean washcloth.  ? ?Apply the CHG Soap to your body ONLY FROM THE NECK DOWN.  Do not use on open wounds or open sores. Avoid contact with your eyes, ears, mouth and genitals (private parts). Wash Face and genitals (private parts)  with your normal soap.  ? ?Wash thoroughly, paying special attention to the area  where your surgery will be performed. ? ?Thoroughly rinse your body with warm water from the neck down. ? ?DO NOT shower/wash with your normal soap after using and rinsing off the CHG Soap. ? ?Pat yourself dry with a CLEAN TOWEL. ? ?Wear CLEAN PAJAMAS to bed the night before surgery ? ?Place CLEAN SHEETS on your bed the night before your surgery ? ?DO NOT SLEEP WITH PETS. ? ? ?Day of Surgery: ? ?Take a shower with CHG soap. ?Wear Clean/Comfortable clothing the morning of surgery ?Do not apply any deodorants/lotions.   ?Remember to brush your teeth WITH YOUR REGULAR TOOTHPASTE. ? ? ? ?If you received a COVID test during your pre-op visit  it is requested that you wear a mask when out in public, stay away from anyone that may not be feeling well and notify your surgeon if you develop symptoms. If you have been in contact with anyone that has tested positive in the last 10 days please notify you surgeon. ? ?  ?Please read over the following fact sheets that you were given.   ?

## 2021-10-06 ENCOUNTER — Encounter (HOSPITAL_COMMUNITY)
Admission: RE | Admit: 2021-10-06 | Discharge: 2021-10-06 | Disposition: A | Discharge status: Home / Self Care | Payer: BC Managed Care – PPO | Source: Ambulatory Visit | Attending: Otolaryngology | Admitting: Otolaryngology

## 2021-10-06 ENCOUNTER — Encounter (HOSPITAL_COMMUNITY): Payer: Self-pay

## 2021-10-06 ENCOUNTER — Other Ambulatory Visit: Payer: Self-pay

## 2021-10-06 VITALS — BP 124/84 | HR 86 | Temp 98.1°F | Resp 17 | Ht 74.0 in | Wt 149.6 lb

## 2021-10-06 DIAGNOSIS — Z20822 Contact with and (suspected) exposure to covid-19: Secondary | ICD-10-CM | POA: Diagnosis not present

## 2021-10-06 DIAGNOSIS — Z01812 Encounter for preprocedural laboratory examination: Secondary | ICD-10-CM | POA: Diagnosis not present

## 2021-10-06 DIAGNOSIS — Z789 Other specified health status: Secondary | ICD-10-CM

## 2021-10-06 DIAGNOSIS — Z01818 Encounter for other preprocedural examination: Secondary | ICD-10-CM

## 2021-10-06 DIAGNOSIS — F1090 Alcohol use, unspecified, uncomplicated: Secondary | ICD-10-CM | POA: Insufficient documentation

## 2021-10-06 HISTORY — DX: Malignant (primary) neoplasm, unspecified: C80.1

## 2021-10-06 LAB — COMPREHENSIVE METABOLIC PANEL
ALT: 23 U/L (ref 0–44)
AST: 29 U/L (ref 15–41)
Albumin: 4 g/dL (ref 3.5–5.0)
Alkaline Phosphatase: 71 U/L (ref 38–126)
Anion gap: 10 (ref 5–15)
BUN: 13 mg/dL (ref 8–23)
CO2: 22 mmol/L (ref 22–32)
Calcium: 10.6 mg/dL — ABNORMAL HIGH (ref 8.9–10.3)
Chloride: 106 mmol/L (ref 98–111)
Creatinine, Ser: 1.04 mg/dL (ref 0.61–1.24)
GFR, Estimated: >60 mL/min (ref >60)
Glucose, Bld: 161 mg/dL — ABNORMAL HIGH (ref 70–99)
Potassium: 4.2 mmol/L (ref 3.5–5.1)
Sodium: 138 mmol/L (ref 135–145)
Total Bilirubin: 0.8 mg/dL (ref 0.3–1.2)
Total Protein: 7.5 g/dL (ref 6.5–8.1)

## 2021-10-06 LAB — CBC
HCT: 43.4 % (ref 39.0–52.0)
Hemoglobin: 15 g/dL (ref 13.0–17.0)
MCH: 33.5 pg (ref 26.0–34.0)
MCHC: 34.6 g/dL (ref 30.0–36.0)
MCV: 96.9 fL (ref 80.0–100.0)
Platelets: 409 10*3/uL — ABNORMAL HIGH (ref 150–400)
RBC: 4.48 MIL/uL (ref 4.22–5.81)
RDW: 13.2 % (ref 11.5–15.5)
WBC: 12.3 10*3/uL — ABNORMAL HIGH (ref 4.0–10.5)
nRBC: 0 % (ref 0.0–0.2)

## 2021-10-06 NOTE — Progress Notes (Signed)
PCP - Dr. Donald Prose ?Cardiologist - Denies ? ?PPM/ICD - Denies ? ?Chest x-ray - N/A ?EKG - N/A ?Stress Test - Denies ?ECHO - Denies ?Cardiac Cath - Denies  ? ?Sleep Study - Denies ? ?Patient denies having diabetes. ? ?Blood Thinner Instructions: N/A ?Aspirin Instructions: N/A ? ?ERAS Protcol - No ? ?COVID TEST- 10/06/21 in PAT ? ? ?Anesthesia review: No ? ?Patient denies shortness of breath, fever, cough and chest pain at PAT appointment ? ? ?All instructions explained to the patient, with a verbal understanding of the material. Patient agrees to go over the instructions while at home for a better understanding. Patient also instructed to self quarantine after being tested for COVID-19. The opportunity to ask questions was provided. ? ? ?

## 2021-10-07 LAB — SARS CORONAVIRUS 2 (TAT 6-24 HRS): SARS Coronavirus 2: NEGATIVE

## 2021-10-10 ENCOUNTER — Other Ambulatory Visit: Payer: Self-pay

## 2021-10-10 ENCOUNTER — Observation Stay (HOSPITAL_COMMUNITY)
Admission: RE | Admit: 2021-10-10 | Discharge: 2021-10-11 | Disposition: A | Discharge status: Home / Self Care | Payer: BC Managed Care – PPO | Source: Ambulatory Visit | Attending: Otolaryngology | Admitting: Otolaryngology

## 2021-10-10 ENCOUNTER — Ambulatory Visit (HOSPITAL_COMMUNITY): Payer: BC Managed Care – PPO | Admitting: Anesthesiology

## 2021-10-10 ENCOUNTER — Encounter (HOSPITAL_COMMUNITY): Payer: Self-pay | Admitting: Otolaryngology

## 2021-10-10 ENCOUNTER — Encounter (HOSPITAL_COMMUNITY)
Admission: RE | Disposition: A | Discharge status: Home / Self Care | Payer: Self-pay | Source: Ambulatory Visit | Attending: Otolaryngology

## 2021-10-10 DIAGNOSIS — F419 Anxiety disorder, unspecified: Secondary | ICD-10-CM | POA: Diagnosis not present

## 2021-10-10 DIAGNOSIS — C770 Secondary and unspecified malignant neoplasm of lymph nodes of head, face and neck: Principal | ICD-10-CM | POA: Diagnosis present

## 2021-10-10 DIAGNOSIS — F17210 Nicotine dependence, cigarettes, uncomplicated: Secondary | ICD-10-CM | POA: Insufficient documentation

## 2021-10-10 DIAGNOSIS — J358 Other chronic diseases of tonsils and adenoids: Secondary | ICD-10-CM | POA: Insufficient documentation

## 2021-10-10 DIAGNOSIS — Z85828 Personal history of other malignant neoplasm of skin: Secondary | ICD-10-CM | POA: Insufficient documentation

## 2021-10-10 DIAGNOSIS — C801 Malignant (primary) neoplasm, unspecified: Secondary | ICD-10-CM | POA: Diagnosis not present

## 2021-10-10 DIAGNOSIS — J449 Chronic obstructive pulmonary disease, unspecified: Secondary | ICD-10-CM | POA: Diagnosis not present

## 2021-10-10 DIAGNOSIS — C7989 Secondary malignant neoplasm of other specified sites: Secondary | ICD-10-CM | POA: Diagnosis not present

## 2021-10-10 DIAGNOSIS — C4442 Squamous cell carcinoma of skin of scalp and neck: Secondary | ICD-10-CM | POA: Diagnosis not present

## 2021-10-10 HISTORY — PX: RADICAL NECK DISSECTION: SHX2284

## 2021-10-10 HISTORY — PX: ESOPHAGOSCOPY: SHX5534

## 2021-10-10 HISTORY — PX: LARYNGOSCOPY AND ESOPHAGOSCOPY: SHX5660

## 2021-10-10 HISTORY — PX: NASOPHARYNGOSCOPY: SHX5210

## 2021-10-10 SURGERY — LARYNGOSCOPY, WITH ESOPHAGOSCOPY
Anesthesia: General | Site: Throat

## 2021-10-10 MED ORDER — PHENYLEPHRINE 40 MCG/ML (10ML) SYRINGE FOR IV PUSH (FOR BLOOD PRESSURE SUPPORT)
PREFILLED_SYRINGE | INTRAVENOUS | Status: DC | PRN
  Administered 2021-10-10 (×2): 80 ug via INTRAVENOUS
  Administered 2021-10-10: 40 ug via INTRAVENOUS
  Administered 2021-10-10 (×2): 80 ug via INTRAVENOUS
  Administered 2021-10-10: 40 ug via INTRAVENOUS

## 2021-10-10 MED ORDER — OXYCODONE HCL 5 MG PO TABS
ORAL_TABLET | ORAL | Status: AC
  Filled 2021-10-10: qty 1

## 2021-10-10 MED ORDER — DEXAMETHASONE SODIUM PHOSPHATE 10 MG/ML IJ SOLN
INTRAMUSCULAR | Status: AC
  Filled 2021-10-10: qty 1

## 2021-10-10 MED ORDER — LIDOCAINE 2% (20 MG/ML) 5 ML SYRINGE
INTRAMUSCULAR | Status: DC | PRN
  Administered 2021-10-10: 40 mg via INTRAVENOUS

## 2021-10-10 MED ORDER — ACETAMINOPHEN 10 MG/ML IV SOLN
INTRAVENOUS | Status: AC
  Filled 2021-10-10: qty 100

## 2021-10-10 MED ORDER — KETOROLAC TROMETHAMINE 30 MG/ML IJ SOLN
INTRAMUSCULAR | Status: AC
  Filled 2021-10-10: qty 1

## 2021-10-10 MED ORDER — EPINEPHRINE HCL (NASAL) 0.1 % NA SOLN
NASAL | Status: AC
  Filled 2021-10-10: qty 30

## 2021-10-10 MED ORDER — MIDAZOLAM HCL 2 MG/2ML IJ SOLN
INTRAMUSCULAR | Status: AC
  Filled 2021-10-10: qty 2

## 2021-10-10 MED ORDER — ONDANSETRON HCL 4 MG/2ML IJ SOLN
INTRAMUSCULAR | Status: DC | PRN
  Administered 2021-10-10: 4 mg via INTRAVENOUS

## 2021-10-10 MED ORDER — ONDANSETRON HCL 4 MG PO TABS
4.0000 mg | ORAL_TABLET | ORAL | Status: DC | PRN
  Administered 2021-10-11: 4 mg via ORAL
  Filled 2021-10-10: qty 1

## 2021-10-10 MED ORDER — CHLORHEXIDINE GLUCONATE 0.12 % MT SOLN
15.0000 mL | Freq: Once | OROMUCOSAL | Status: AC
  Administered 2021-10-10: 15 mL via OROMUCOSAL
  Filled 2021-10-10: qty 15

## 2021-10-10 MED ORDER — CALCIUM CARBONATE ANTACID 500 MG PO CHEW: 1000.0000 mg | CHEWABLE_TABLET | Freq: Two times a day (BID) | ORAL | Status: DC | PRN

## 2021-10-10 MED ORDER — DEXAMETHASONE SODIUM PHOSPHATE 10 MG/ML IJ SOLN
INTRAMUSCULAR | Status: DC | PRN
  Administered 2021-10-10: 10 mg via INTRAVENOUS

## 2021-10-10 MED ORDER — OXYCODONE HCL 5 MG/5ML PO SOLN: 5.0000 mg | Freq: Once | ORAL | Status: AC | PRN

## 2021-10-10 MED ORDER — FENTANYL CITRATE (PF) 250 MCG/5ML IJ SOLN
INTRAMUSCULAR | Status: AC
  Filled 2021-10-10: qty 5

## 2021-10-10 MED ORDER — BACITRACIN ZINC 500 UNIT/GM EX OINT
TOPICAL_OINTMENT | CUTANEOUS | Status: AC
  Filled 2021-10-10: qty 28.35

## 2021-10-10 MED ORDER — ONDANSETRON HCL 4 MG/2ML IJ SOLN: 4.0000 mg | Freq: Once | INTRAMUSCULAR | Status: DC | PRN

## 2021-10-10 MED ORDER — MIDAZOLAM HCL 2 MG/2ML IJ SOLN
INTRAMUSCULAR | Status: DC | PRN
Start: 2021-10-10 — End: 2021-10-10
  Administered 2021-10-10: 2 mg via INTRAVENOUS

## 2021-10-10 MED ORDER — ALBUTEROL SULFATE (2.5 MG/3ML) 0.083% IN NEBU: 3.0000 mL | INHALATION_SOLUTION | Freq: Four times a day (QID) | RESPIRATORY_TRACT | Status: DC | PRN

## 2021-10-10 MED ORDER — PHENYLEPHRINE 40 MCG/ML (10ML) SYRINGE FOR IV PUSH (FOR BLOOD PRESSURE SUPPORT)
PREFILLED_SYRINGE | INTRAVENOUS | Status: AC
  Filled 2021-10-10: qty 10

## 2021-10-10 MED ORDER — ROCURONIUM BROMIDE 10 MG/ML (PF) SYRINGE
PREFILLED_SYRINGE | INTRAVENOUS | Status: AC
  Filled 2021-10-10: qty 10

## 2021-10-10 MED ORDER — PROPOFOL 10 MG/ML IV BOLUS
INTRAVENOUS | Status: AC
  Filled 2021-10-10: qty 20

## 2021-10-10 MED ORDER — LIDOCAINE-EPINEPHRINE 1 %-1:100000 IJ SOLN
INTRAMUSCULAR | Status: AC
  Filled 2021-10-10: qty 1

## 2021-10-10 MED ORDER — OXYCODONE HCL 5 MG PO TABS
5.0000 mg | ORAL_TABLET | Freq: Once | ORAL | Status: AC | PRN
  Administered 2021-10-10: 5 mg via ORAL

## 2021-10-10 MED ORDER — ONDANSETRON HCL 4 MG/2ML IJ SOLN: 4.0000 mg | INTRAMUSCULAR | Status: DC | PRN

## 2021-10-10 MED ORDER — BACITRACIN ZINC 500 UNIT/GM EX OINT
1.0000 "application " | TOPICAL_OINTMENT | Freq: Three times a day (TID) | CUTANEOUS | Status: DC
  Administered 2021-10-10 – 2021-10-11 (×3): 1 via TOPICAL
  Filled 2021-10-10 (×3): qty 28.35

## 2021-10-10 MED ORDER — ORAL CARE MOUTH RINSE: 15.0000 mL | Freq: Once | OROMUCOSAL | Status: AC

## 2021-10-10 MED ORDER — DEXMEDETOMIDINE (PRECEDEX) IN NS 20 MCG/5ML (4 MCG/ML) IV SYRINGE
PREFILLED_SYRINGE | INTRAVENOUS | Status: DC | PRN
  Administered 2021-10-10: 8 ug via INTRAVENOUS

## 2021-10-10 MED ORDER — ADULT MULTIVITAMIN W/MINERALS CH
1.0000 | ORAL_TABLET | Freq: Every day | ORAL | Status: DC
  Administered 2021-10-10 – 2021-10-11 (×2): 1 via ORAL
  Filled 2021-10-10 (×2): qty 1

## 2021-10-10 MED ORDER — SUGAMMADEX SODIUM 200 MG/2ML IV SOLN
INTRAVENOUS | Status: DC | PRN
  Administered 2021-10-10: 150 mg via INTRAVENOUS

## 2021-10-10 MED ORDER — ROCURONIUM BROMIDE 10 MG/ML (PF) SYRINGE
PREFILLED_SYRINGE | INTRAVENOUS | Status: DC | PRN
  Administered 2021-10-10: 50 mg via INTRAVENOUS

## 2021-10-10 MED ORDER — OXYMETAZOLINE HCL 0.05 % NA SOLN
NASAL | Status: AC
  Filled 2021-10-10: qty 30

## 2021-10-10 MED ORDER — NICOTINE 21 MG/24HR TD PT24
21.0000 mg | MEDICATED_PATCH | Freq: Every day | TRANSDERMAL | Status: DC
  Administered 2021-10-11: 21 mg via TRANSDERMAL
  Filled 2021-10-10 (×2): qty 1

## 2021-10-10 MED ORDER — LIDOCAINE 2% (20 MG/ML) 5 ML SYRINGE
INTRAMUSCULAR | Status: AC
  Filled 2021-10-10: qty 5

## 2021-10-10 MED ORDER — ESCITALOPRAM OXALATE 20 MG PO TABS
20.0000 mg | ORAL_TABLET | Freq: Every day | ORAL | Status: DC
  Administered 2021-10-10 – 2021-10-11 (×2): 20 mg via ORAL
  Filled 2021-10-10 (×2): qty 1

## 2021-10-10 MED ORDER — HYDROCODONE-ACETAMINOPHEN 5-325 MG PO TABS
1.0000 | ORAL_TABLET | ORAL | Status: DC | PRN
  Administered 2021-10-10 – 2021-10-11 (×3): 2 via ORAL
  Filled 2021-10-10 (×3): qty 2

## 2021-10-10 MED ORDER — CARBOXYMETHYLCELLUL-GLYCERIN 0.5-0.9 % OP SOLN: 1.0000 [drp] | Freq: Two times a day (BID) | OPHTHALMIC | Status: DC | PRN

## 2021-10-10 MED ORDER — OXYMETAZOLINE HCL 0.05 % NA SOLN
NASAL | Status: DC | PRN
  Administered 2021-10-10: 1 via TOPICAL

## 2021-10-10 MED ORDER — DICLOFENAC SODIUM 1 % EX GEL
2.0000 g | Freq: Four times a day (QID) | CUTANEOUS | Status: DC | PRN
  Filled 2021-10-10: qty 100

## 2021-10-10 MED ORDER — IBUPROFEN 100 MG/5ML PO SUSP: 400.0000 mg | Freq: Four times a day (QID) | ORAL | Status: DC | PRN

## 2021-10-10 MED ORDER — LACTATED RINGERS IV SOLN: INTRAVENOUS | Status: DC

## 2021-10-10 MED ORDER — FENTANYL CITRATE (PF) 250 MCG/5ML IJ SOLN
INTRAMUSCULAR | Status: DC | PRN
  Administered 2021-10-10: 50 ug via INTRAVENOUS
  Administered 2021-10-10: 100 ug via INTRAVENOUS
  Administered 2021-10-10 (×2): 50 ug via INTRAVENOUS

## 2021-10-10 MED ORDER — KETOROLAC TROMETHAMINE 30 MG/ML IJ SOLN
30.0000 mg | Freq: Once | INTRAMUSCULAR | Status: AC | PRN
  Administered 2021-10-10: 30 mg via INTRAVENOUS

## 2021-10-10 MED ORDER — HYDROMORPHONE HCL 1 MG/ML IJ SOLN
INTRAMUSCULAR | Status: AC
  Filled 2021-10-10: qty 1

## 2021-10-10 MED ORDER — DEXTROSE-NACL 5-0.9 % IV SOLN: INTRAVENOUS | Status: DC

## 2021-10-10 MED ORDER — ONDANSETRON HCL 4 MG/2ML IJ SOLN
INTRAMUSCULAR | Status: AC
  Filled 2021-10-10: qty 2

## 2021-10-10 MED ORDER — LABETALOL HCL 5 MG/ML IV SOLN
INTRAVENOUS | Status: AC
  Filled 2021-10-10: qty 4

## 2021-10-10 MED ORDER — HYDROMORPHONE HCL 1 MG/ML IJ SOLN
0.2500 mg | INTRAMUSCULAR | Status: DC | PRN
  Administered 2021-10-10: 0.5 mg via INTRAVENOUS

## 2021-10-10 MED ORDER — AMISULPRIDE (ANTIEMETIC) 5 MG/2ML IV SOLN: 10.0000 mg | Freq: Once | INTRAVENOUS | Status: DC | PRN

## 2021-10-10 MED ORDER — MEPERIDINE HCL 25 MG/ML IJ SOLN: 6.2500 mg | INTRAMUSCULAR | Status: DC | PRN

## 2021-10-10 MED ORDER — 0.9 % SODIUM CHLORIDE (POUR BTL) OPTIME
TOPICAL | Status: DC | PRN
  Administered 2021-10-10: 1000 mL

## 2021-10-10 MED ORDER — POLYVINYL ALCOHOL 1.4 % OP SOLN
1.0000 [drp] | Freq: Two times a day (BID) | OPHTHALMIC | Status: DC | PRN
  Filled 2021-10-10: qty 15

## 2021-10-10 MED ORDER — PROPOFOL 10 MG/ML IV BOLUS
INTRAVENOUS | Status: DC | PRN
Start: 2021-10-10 — End: 2021-10-10
  Administered 2021-10-10 (×2): 50 mg via INTRAVENOUS

## 2021-10-10 MED ORDER — ACETAMINOPHEN 10 MG/ML IV SOLN
INTRAVENOUS | Status: DC | PRN
  Administered 2021-10-10: 1000 mg via INTRAVENOUS

## 2021-10-10 MED ORDER — DEXMEDETOMIDINE (PRECEDEX) IN NS 20 MCG/5ML (4 MCG/ML) IV SYRINGE
PREFILLED_SYRINGE | INTRAVENOUS | Status: AC
  Filled 2021-10-10: qty 5

## 2021-10-10 SURGICAL SUPPLY — 54 items
ATTRACTOMAT 16X20 MAGNETIC DRP (DRAPES) ×1 IMPLANT
BAG COUNTER SPONGE SURGICOUNT (BAG) ×4 IMPLANT
CANISTER SUCT 3000ML PPV (MISCELLANEOUS) ×4 IMPLANT
CLEANER TIP ELECTROSURG 2X2 (MISCELLANEOUS) ×4 IMPLANT
CNTNR URN SCR LID CUP LEK RST (MISCELLANEOUS) IMPLANT
CONT SPEC 4OZ STRL OR WHT (MISCELLANEOUS) ×4
CORD BIPOLAR FORCEPS 12FT (ELECTRODE) ×4 IMPLANT
COVER BACK TABLE 60X90IN (DRAPES) ×4 IMPLANT
COVER MAYO STAND STRL (DRAPES) ×4 IMPLANT
COVER SURGICAL LIGHT HANDLE (MISCELLANEOUS) ×4 IMPLANT
DRAIN JP 15F RND TROCAR (DRAIN) ×1 IMPLANT
DRAPE HALF SHEET 40X57 (DRAPES) ×4 IMPLANT
DRSG TELFA 3X8 NADH (GAUZE/BANDAGES/DRESSINGS) ×4 IMPLANT
ELECT COATED BLADE 2.86 ST (ELECTRODE) ×4 IMPLANT
ELECT REM PT RETURN 9FT ADLT (ELECTROSURGICAL) ×4
ELECTRODE REM PT RTRN 9FT ADLT (ELECTROSURGICAL) ×3 IMPLANT
EVACUATOR SILICONE 100CC (DRAIN) ×4 IMPLANT
FORCEPS BIPOLAR SPETZLER 8 1.0 (NEUROSURGERY SUPPLIES) ×4 IMPLANT
GAUZE 4X4 16PLY ~~LOC~~+RFID DBL (SPONGE) ×5 IMPLANT
GLOVE SURG LTX SZ7.5 (GLOVE) ×4 IMPLANT
GOWN STRL REUS W/ TWL LRG LVL3 (GOWN DISPOSABLE) ×6 IMPLANT
GOWN STRL REUS W/TWL LRG LVL3 (GOWN DISPOSABLE) ×2
GUARD TEETH (MISCELLANEOUS) ×1 IMPLANT
KIT BASIN OR (CUSTOM PROCEDURE TRAY) ×4 IMPLANT
KIT TURNOVER KIT B (KITS) ×4 IMPLANT
NDL PRECISIONGLIDE 27X1.5 (NEEDLE) ×3 IMPLANT
NEEDLE PRECISIONGLIDE 27X1.5 (NEEDLE) ×4 IMPLANT
PAD ARMBOARD 7.5X6 YLW CONV (MISCELLANEOUS) ×8 IMPLANT
PAD DRESSING TELFA 3X8 NADH (GAUZE/BANDAGES/DRESSINGS) IMPLANT
PATTIES SURGICAL .5 X3 (DISPOSABLE) ×4 IMPLANT
PENCIL FOOT CONTROL (ELECTRODE) ×4 IMPLANT
SHEARS HARMONIC 9CM CVD (BLADE) ×4 IMPLANT
SOL ANTI FOG 6CC (MISCELLANEOUS) IMPLANT
SOLUTION ANTI FOG 6CC (MISCELLANEOUS) ×1
SPECIMEN JAR MEDIUM (MISCELLANEOUS) IMPLANT
SPECIMEN JAR SMALL (MISCELLANEOUS) ×4 IMPLANT
SPONGE INTESTINAL PEANUT (DISPOSABLE) ×1 IMPLANT
STAPLER VISISTAT 35W (STAPLE) ×4 IMPLANT
SUCTION FRAZIER HANDLE 10FR (MISCELLANEOUS) ×1
SUCTION TUBE FRAZIER 10FR DISP (MISCELLANEOUS) IMPLANT
SUT CHROMIC 3 0 SH 27 (SUTURE) ×4 IMPLANT
SUT CHROMIC 5 0 P 3 (SUTURE) IMPLANT
SUT ETHILON 3 0 PS 1 (SUTURE) ×5 IMPLANT
SUT ETHILON 5 0 PS 2 18 (SUTURE) IMPLANT
SUT SILK 2 0 REEL (SUTURE) ×1 IMPLANT
SUT SILK 3 0 SH 30 (SUTURE) ×1 IMPLANT
SUT SILK 3 0 SH CR/8 (SUTURE) ×4 IMPLANT
SUT SILK 4 0 REEL (SUTURE) ×4 IMPLANT
SUT VIC AB 3-0 SH 18 (SUTURE) IMPLANT
TOWEL GREEN STERILE (TOWEL DISPOSABLE) ×4 IMPLANT
TRAY ENT MC OR (CUSTOM PROCEDURE TRAY) ×4 IMPLANT
TRAY FOLEY MTR SLVR 14FR STAT (SET/KITS/TRAYS/PACK) ×1 IMPLANT
TUBE CONNECTING 12X1/4 (SUCTIONS) ×4 IMPLANT
WATER STERILE IRR 1000ML POUR (IV SOLUTION) ×4 IMPLANT

## 2021-10-10 NOTE — Anesthesia Procedure Notes (Addendum)
Procedure Name: Intubation ?Date/Time: 10/10/2021 7:39 AM ?Performed by: Michele Rockers, CRNA ?Pre-anesthesia Checklist: Patient identified, Patient being monitored, Timeout performed, Emergency Drugs available and Suction available ?Patient Re-evaluated:Patient Re-evaluated prior to induction ?Oxygen Delivery Method: Circle system utilized ?Preoxygenation: Pre-oxygenation with 100% oxygen ?Induction Type: IV induction ?Ventilation: Mask ventilation without difficulty ?Laryngoscope Size: Mac, 3 and Glidescope ?Grade View: Grade I ?Tube type: Oral ?Tube size: 7.5 mm ?Number of attempts: 1 ?Airway Equipment and Method: Stylet ?Placement Confirmation: ETT inserted through vocal cords under direct vision, positive ETCO2 and breath sounds checked- equal and bilateral ?Secured at: 24 cm ?Tube secured with: Tape ?Dental Injury: Teeth and Oropharynx as per pre-operative assessment  ? ? ? ? ?

## 2021-10-10 NOTE — Anesthesia Postprocedure Evaluation (Signed)
Anesthesia Post Note ? ?Patient: ADELFO DIEBEL ? ?Procedure(s) Performed: DIRECT LARYNGOSCOPY WITH MULTIPLE BIOPSIES (Throat) ?NASOPHARYNGOSCOPY (Nose) ?MODIFIED RADICAL NECK DISSECTION (Left: Neck) ?ESOPHAGOSCOPY (Throat) ? ?  ? ?Patient location during evaluation: PACU ?Anesthesia Type: General ?Level of consciousness: awake and alert, oriented and patient cooperative ?Pain management: pain level controlled ?Vital Signs Assessment: post-procedure vital signs reviewed and stable ?Respiratory status: spontaneous breathing, nonlabored ventilation and respiratory function stable ?Cardiovascular status: blood pressure returned to baseline and stable ?Postop Assessment: no apparent nausea or vomiting ?Anesthetic complications: no ? ? ?No notable events documented. ? ?Last Vitals:  ?Vitals:  ? 10/10/21 1020 10/10/21 1025  ?BP:  123/70  ?Pulse: 78 80  ?Resp: 15 14  ?Temp:    ?SpO2: 99% 99%  ?  ?Last Pain:  ?Vitals:  ? 10/10/21 1020  ?TempSrc:   ?PainSc: Asleep  ? ? ?  ?  ?  ?  ?  ?  ? ?Jarome Matin Emmani Lesueur ? ? ? ? ?

## 2021-10-10 NOTE — Transfer of Care (Signed)
Immediate Anesthesia Transfer of Care Note ? ?Patient: [facility] ? ?Procedure(s) Performed: DIRECT LARYNGOSCOPY WITH MULTIPLE BIOPSIES (Throat) ?NASOPHARYNGOSCOPY (Nose) ?MODIFIED RADICAL NECK DISSECTION (Left: Neck) ?ESOPHAGOSCOPY (Throat) ? ?Patient Location: PACU ? ?Anesthesia Type:General ? ?Level of Consciousness: drowsy, patient cooperative and responds to stimulation ? ?Airway & Oxygen Therapy: Patient Spontanous Breathing and Patient connected to nasal cannula oxygen ? ?Post-op Assessment: Report given to RN, Post -op Vital signs reviewed and stable and Patient moving all extremities X 4 ? ?Post vital signs: Reviewed and stable ? ?Last Vitals:  ?Vitals Value Taken Time  ?BP 132/70 10/10/21 1005  ?Temp    ?Pulse 80 10/10/21 1009  ?Resp 12 10/10/21 1009  ?SpO2 97 % 10/10/21 1009  ?Vitals shown include unvalidated device data. ? ?Last Pain:  ?Vitals:  ? 10/10/21 0614  ?TempSrc:   ?PainSc: 0-No pain  ?   ? ?  ? ?Complications: No notable events documented. ?

## 2021-10-10 NOTE — Op Note (Addendum)
OPERATIVE REPORT ? ?DATE OF SURGERY: 10/10/2021 ? ?PATIENT:  [facility],  61 y.o. male ? 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 ?PROCEDURE:  Procedure(s): ?DIRECT LARYNGOSCOPY WITH MULTIPLE BIOPSIES ?NASOPHARYNGOSCOPY ?MODIFIED RADICAL NECK DISSECTION ?ESOPHAGOSCOPY ? ?SURGEON:  Beckie Salts, MD ? ?ASSISTANTS: RNFA ? ?ANESTHESIA:   General  ? ?EBL: 50 ml ? ?DRAINS: 15 [place] round JP ? ?LOCAL MEDICATIONS USED:  None ? ?SPECIMEN: 1.  Biopsy left piriform sinus ?2.  Biopsy left tongue base ?3.  Biopsy left vallecula. ?4.  Biopsy left nasopharynx. ?5.  Left posterolateral neck dissection including levels 2 through 5.  Specimen is oriented on a towel and marked for the various zones. ? ?COUNTS:  Correct ? ?PROCEDURE DETAILS: ?The patient was taken to the operating room and placed on the operating table in the supine position. Following induction of general endotracheal anesthesia, the table was turned 90 degrees and the patient was draped in a standard fashion for upper endoscopy.  A maxillary tooth protector was used throughout the endoscopy portion of the procedure. ? ?1.  Esophagoscopy.  Rigid cervical esophagoscope was passed into the oral cavity and into the esophageal introitus.  It was then advanced into the esophagus as far as the scope would allow and then slowly withdrawn.  Secretions were suctioned and circumferential esophageal wall was inspected.  No mucosal lesions were identified.  The scope was withdrawn. ? ?2.  Direct laryngoscopy with multiple biopsies.  A Jako laryngoscope was then used to visualize the hypopharynx and larynx.  There were no lesions identified throughout the examination.  Directed biopsies were taken from the left tongue base, vallecula, and piriform sinus.  Manual palpation of the tongue base and pharynx also failed to reveal any suspicious masses. ? ?3.   Nasopharyngoscopy with biopsy of left nasopharynx.  A 0 degree nasal endoscope was used in the left nasal cavity to inspect the posterior nasal cavity and the nasopharynx on both sides.  The nasopharynx was symmetric.  No mucosal lesions were identified.  Blind biopsy was taken from the left side nasopharynx and the fossa of Rosenmuller using a small up-biting endoscopy forceps.  Topical Afrin was used prior to the biopsy for decongestant effect.  The scope was withdrawn. ? ?4.  Left posterolateral neck dissection including levels 2 through 5.  A left hockey-stick type incision was outlined with a marking pen starting at the mastoid bone and extending down towards the clavicle anteriorly.  Electrocautery was used to incise the skin and subcutaneous tissue and through the platysma layer.  Subplatysmal flaps were developed anteriorly to just beyond the anterior border of the short sternocleidomastoid muscle and posteriorly to the midline neck.  The mass was identified in the mid to lower level 5 area.  It appeared to be intimate with the trapezius muscle.  The external jugular vein was ligated between clamps and divided.  4-0 silk ties were used throughout the case.  Lymph node bearing tissue in the lateral soft tissue of the sternocleidomastoid muscle was dissected off including the external jugular vein and was sent separately for pathologic evaluation. ? ?The fibrofatty tissue medial to the sternocleidomastoid was then dissected off the medial side of the muscle anteriorly towards the strap muscles.  There are multiple enlarged lymph nodes found in level 2 and 3.  Additional attempts were made to preserve the spinal accessory nerve but based on the disease that  was in the level 5 area of this nerve could not be preserved.  Lymph node bearing tissue was then dissected off of the upper internal jugular vein and then the fascia of the splenius capitis and levator scapula.  This was all then brought under the muscle  and brought posteriorly.  The level 5 was then dissected.  The great auricular nerve, the occipital nerve and the spinal accessory nerves were all intimate with the tumor and could not be preserved.  These were all sacrificed.  Lymph node bearing tissue level 5 was all dissected out including the mass.  The mass was adherent with the trapezius so a cuff of trapezius muscle was taken the mid anterior portion.  No additional pathologic lymph nodes were identified.  Specimen was delivered and sent for pathologic valuation.  The wound was irrigated with saline.  Hemostasis was completed.  The drain was exited through separate stab incision.  This was secured in place with silk suture.  The flaps were laid down and the incision was reapproximated in 2 layers using interrupted 3-0 chromic on the platysmal layer and staples on the skin.  The drain was charged.  Bacitracin was applied.  Patient was awakened extubated transferred to recovery in stable condition. ? ?Note: A surgical assistant was required in this case for retraction, suctioning and identification of bleeders. ? ?PATIENT DISPOSITION:  To PACU, stable ? ? ? ?

## 2021-10-10 NOTE — Addendum Note (Signed)
Addendum  created 10/10/21 1137 by Michele Rockers, CRNA  ? Flowsheet accepted  ?  ?

## 2021-10-10 NOTE — Anesthesia Preprocedure Evaluation (Addendum)
Anesthesia Evaluation  ?Patient identified by MRN, date of birth, ID band ?Patient awake ? ? ? ?Reviewed: ?Allergy & Precautions, NPO status , Patient's Chart, lab work & pertinent test results ? ?History of Anesthesia Complications ?(+) history of anesthetic complications ? ?Airway ?Mallampati: II ? ?TM Distance: >3 FB ?Neck ROM: Full ? ? ? Dental ? ?(+) Teeth Intact, Dental Advisory Given ?  ?Pulmonary ?COPD (no inhalers), Current Smoker and Patient abstained from smoking.,  ?Metastatic squamous neck cancer, unknown primary ? ?CT neck: ?1. No acute abnormality in the neck to account for the patient's ?symptoms. No abnormal soft tissue mass, fluid collection, or ?pathologic lymphadenopathy. ?2. Hyperdensity in the incompletely imaged right globe may reflect ?history of silicone injection. Correlate with history. ? ?CT chest: ?Mild diffuse bronchial wall thickening with very mild ?centrilobular and paraseptal emphysema; imaging findings suggestive ?of underlying COPD. ?? ?  ?Pulmonary exam normal ?breath sounds clear to auscultation ? ? ? ? ? ? Cardiovascular ?negative cardio ROS ?Normal cardiovascular exam ?Rhythm:Regular Rate:Normal ? ? ?  ?Neuro/Psych ?PSYCHIATRIC DISORDERS Anxiety   ? GI/Hepatic ?negative GI ROS, (+)  ?  ? 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 alcohol use,   ?Endo/Other  ?negative endocrine ROS ? Renal/GU ?negative Renal ROS  ?negative genitourinary ?  ?Musculoskeletal ? ?(+) Arthritis , Osteoarthritis,   ? Abdominal ?  ?Peds ? Hematology ?negative hematology ROS ?(+) hct 43.4   ?Anesthesia Other Findings ? ? Reproductive/Obstetrics ?negative OB ROS ? ?  ? ? ? ? ? ? ? ? ? ? ? ? ? ?  ?  ? ? ? ? ? ? ? ?Anesthesia Physical ?Anesthesia Plan ? ?ASA: 3 ? ?Anesthesia Plan: General  ? ?Post-op Pain Management: Ofirmev IV (intra-op)*  ? ?Induction: Intravenous ? ?PONV Risk Score and Plan: 1 and Ondansetron, Dexamethasone, Midazolam and Treatment may vary due  to age or medical condition ? ?Airway Management Planned: Oral ETT and Video Laryngoscope Planned ? ?Additional Equipment: None ? ?Intra-op Plan:  ? ?Post-operative Plan: Extubation in OR ? ?Informed Consent: I have reviewed the patients History and Physical, chart, labs and discussed the procedure including the risks, benefits and alternatives for the proposed anesthesia with the patient or authorized representative who has indicated his/her understanding and acceptance.  ? ? ? ?Dental advisory given ? ?Plan Discussed with: CRNA ? ?Anesthesia Plan Comments:   ? ? ? ? ? ? ?Anesthesia Quick Evaluation ? ?

## 2021-10-10 NOTE — Interval H&P Note (Signed)
History and Physical Interval Note: ? ?10/10/2021 ?7:19 AM ? 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  The various methods of treatment have been discussed with the patient and family. After consideration of risks, benefits and other options for treatment, the patient has consented to  Procedure(s): ?DIRECT LARYNGOSCOPY WITH MULTIPLE BIOPSIES; ESOPHAGOSCOPY (N/A) ?NASOPHARYNGOSCOPY (N/A) ?MODIFIED RADICAL NECK DISSECTION (Left) as a surgical intervention.  The patient's history has been reviewed, patient examined, no change in status, stable for surgery.  I have reviewed the patient's chart and labs.  Questions were answered to the patient's satisfaction.   ? ? ?Izora Gala ? ? ?

## 2021-10-10 NOTE — Progress Notes (Signed)
Patient ID: [name], male   DOB: 29-Jun-1961, 61 y.o.   MRN: [id number] ? ?Postop check: ? ?Patient was asleep on rounds but easily arousable.  Breathing and voice are clear.  Neck looks excellent.  No swelling or hematoma.  Drain in place.  Staples are excellent. ? ?Stable postop.  Continue overnight care. ?

## 2021-10-11 ENCOUNTER — Encounter (HOSPITAL_COMMUNITY): Payer: Self-pay | Admitting: Otolaryngology

## 2021-10-11 DIAGNOSIS — J358 Other chronic diseases of tonsils and adenoids: Secondary | ICD-10-CM | POA: Diagnosis not present

## 2021-10-11 DIAGNOSIS — Z85828 Personal history of other malignant neoplasm of skin: Secondary | ICD-10-CM | POA: Diagnosis not present

## 2021-10-11 DIAGNOSIS — F17210 Nicotine dependence, cigarettes, uncomplicated: Secondary | ICD-10-CM | POA: Diagnosis not present

## 2021-10-11 DIAGNOSIS — C770 Secondary and unspecified malignant neoplasm of lymph nodes of head, face and neck: Secondary | ICD-10-CM | POA: Diagnosis not present

## 2021-10-11 DIAGNOSIS — C801 Malignant (primary) neoplasm, unspecified: Secondary | ICD-10-CM | POA: Diagnosis not present

## 2021-10-11 MED ORDER — HYDROCODONE-ACETAMINOPHEN 7.5-325 MG PO TABS: 1.0000 | ORAL_TABLET | Freq: Four times a day (QID) | ORAL | 0 refills | Status: DC | PRN

## 2021-10-11 MED ORDER — ONDANSETRON 4 MG PO TBDP: 4.0000 mg | ORAL_TABLET | Freq: Three times a day (TID) | ORAL | 0 refills | Status: DC | PRN

## 2021-10-11 NOTE — Discharge Summary (Signed)
Physician Discharge Summary  ?Patient ID: ?Richard Terry ?MRN: [id number] ?DOB/AGE: Sep 09, 1960 61 y.o. ? ?Admit date: 10/10/2021 ?Discharge date: 10/11/2021 ? ?Admission Diagnoses: Metastatic cancer left neck ? ?Discharge Diagnoses:  ?Principal Problem: ?  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 ? ?Discharged Condition: good ? ?Hospital Course: No complications ? ?Consults: none ? ?Significant Diagnostic Studies: none ? ?Treatments: surgery: Upper endoscopy with multiple biopsies and left modified neck dissection ? ?Discharge Exam: ?Blood pressure 126/81, pulse 75, temperature 98.6 ?F (37 ?C), resp. rate 18, height '6\' 2"'$  (1.88 m), weight 68 kg, SpO2 96 %. ?PHYSICAL EXAM: ?Awake and alert.  Incision intact.  Staples are clean and dry.  No swelling. ? ?Disposition: Discharge disposition: 01-Home or Self Care ? ? ? ? ? ? ?Discharge Instructions   ? ? Diet - low sodium heart healthy   Complete by: As directed ?  ? Discharge wound care:   Complete by: As directed ?  ? Empty drain, recharge, record amount 3 times daily.  ? Increase activity slowly   Complete by: As directed ?  ? ?  ? ?Allergies as of 10/11/2021   ? ?   Reactions  ? Bupropion Other (See Comments)  ? Caused bloodshot eyes  ? ?  ? ?  ?Medication List  ?  ? ?TAKE these medications   ? ?acetaminophen 500 MG tablet ?Commonly known as: TYLENOL ?Take 1,000 mg by mouth every 6 (six) hours as needed for moderate pain. ?  ?albuterol 108 (90 Base) MCG/ACT inhaler ?Commonly known as: VENTOLIN HFA ?Inhale 1-2 puffs into the lungs every 6 (six) hours as needed for wheezing or shortness of breath. ?  ?diclofenac Sodium 1 % Gel ?Commonly known as: VOLTAREN ?Apply 1 application. topically 4 (four) times daily as needed (pain). ?  ?diphenhydramine-acetaminophen 25-500 MG Tabs tablet ?Commonly known as: TYLENOL PM ?Take 1 tablet by mouth at bedtime as needed (sleep). ?  ?escitalopram 20 MG tablet ?Commonly known as: LEXAPRO ?Take 20 mg by mouth daily. ?   ?HYDROcodone-acetaminophen 7.5-325 MG tablet ?Commonly known as: Norco ?Take 1 tablet by mouth every 6 (six) hours as needed for moderate pain. ?  ?LUBRICATING EYE DROPS OP ?Place 1 drop into both eyes 2 (two) times daily as needed (dry eyes). ?  ?multivitamin with minerals Tabs tablet ?Take 1 tablet by mouth daily. ?  ?nicotine 21 mg/24hr patch ?Commonly known as: NICODERM CQ - dosed in mg/24 hours ?Place 1 patch (21 mg total) onto the skin daily. Apply 21 mg patch daily x 6 wk, then '14mg'$  patch daily x 2 wk, then 7 mg patch daily x 2 wk ?  ?nicotine 14 mg/24hr patch ?Commonly known as: NICODERM CQ - dosed in mg/24 hours ?Place 1 patch (14 mg total) onto the skin daily. Apply 21 mg patch daily x 6 wk, then '14mg'$  patch daily x 2 wk, then 7 mg patch daily x 2 wk ?  ?nicotine 7 mg/24hr patch ?Commonly known as: NICODERM CQ - dosed in mg/24 hr ?Place 1 patch (7 mg total) onto the skin daily. Apply 21 mg patch daily x 6 wk, then '14mg'$  patch daily x 2 wk, then 7 mg patch daily x 2 wk ?  ?ondansetron 4 MG disintegrating tablet ?Commonly known as: ZOFRAN-ODT ?Take 1 tablet (4 mg total) by mouth every 8 (eight) hours as needed for nausea or vomiting. ?  ?Tums Ultra 1000 400 MG chewable tablet ?Generic drug: calcium elemental as carbonate ?Chew 1,000-2,000 mg by mouth 2 (  two) times daily as needed for heartburn. ?  ? ?  ? ?  ?  ? ? ?  ?Discharge Care Instructions  ?(From admission, onward)  ?  ? ? ?  ? ?  Start     Ordered  ? 10/11/21 0000  Discharge wound care:       ?Comments: Empty drain, recharge, record amount 3 times daily.  ? 10/11/21 1700  ? ?  ?  ? ?  ? ? Follow-up Information   ? ? Izora Gala, MD Follow up on 10/14/2021.   ?Specialty: Otolaryngology ?Why: Come to the office at 1:00 for drain removal. ?Contact information: ?[address] ?[address] ?[place] [address] ?[phone] ? ? ?  ?  ? ?  ?  ? ?  ? ? ?Signed: ?Izora Gala ?10/11/2021, 5:02 PM ? ? ? ?

## 2021-10-11 NOTE — Progress Notes (Signed)
Patient ID: Richard Terry, male   DOB: May 16, 1961, 61 y.o.   MRN: [id number] ?Subjective: ?Doing well, minimal pain, controlled with medication. ? ?Objective: ?Vital signs in last 24 hours: ?Temp:  [97 ?F (36.1 ?C)-98.5 ?F (36.9 ?C)] 98.5 ?F (36.9 ?C) (03/28 0441) ?Pulse Rate:  [60-84] 69 (03/28 0441) ?Resp:  [9-17] 17 (03/28 0441) ?BP: (111-132)/(59-89) 112/70 (03/28 0441) ?SpO2:  [92 %-99 %] 97 % (03/28 0441) ?Weight change:  ?  ? ?Intake/Output from previous day: ?03/27 0701 - 03/28 0700 ?In: 3045.1 [P.O.:720; I.V.:2275.1] ?Out: 1845 [NUUVO:5366; Drains:145; Blood:50] ?Intake/Output this shift: ?No intake/output data recorded. ? ?PHYSICAL EXAM: ?Awake and alert.  Wife in the room.  Neck looks excellent.  Drain functioning. ? ?Lab Results: ?No results for input(s): WBC, HGB, HCT, PLT in the last 72 hours. ?BMET ?No results for input(s): NA, K, CL, CO2, GLUCOSE, BUN, CREATININE, CALCIUM in the last 72 hours. ? ?Studies/Results: ?No results found. ? ?Medications: I have reviewed the patient's current medications. ? ?Assessment/Plan: ?Stable postop.  Continue with drain until Thursday or Friday.  He may go home with the drain if he desires.  He will think about it and see about later today. ? LOS: 1 day  ? ? ? ?Izora Gala ?10/11/2021, 8:47 AM ? ? ?

## 2021-10-11 NOTE — Progress Notes (Signed)
RN gave patient discharge instructions and the patient stated understanding. IV has been removed and belongings have been packed. ?

## 2021-10-11 NOTE — Discharge Instructions (Signed)
Keep the incision clean and dry.  Apply bacitracin ointment twice daily. ?

## 2021-10-20 LAB — SURGICAL PATHOLOGY

## 2021-10-21 ENCOUNTER — Telehealth: Payer: Self-pay

## 2021-10-21 NOTE — Telephone Encounter (Signed)
Received VM from patient's wife inquiring about status of patient's referral from Dr. Izora Gala.  ? ?Returned wife's call and let her know that Dr. Isidore Moos was out of the office this week, but someone from our department would call back early next week with an update. Informed her we needed Dr. Isidore Moos to weigh in on whether the additional testing Dr. Constance Holster ordered on the tissue sample needs to result before she meets with patient, or if she's comfortable meeting patient/wife before final results. Wife verbalized understanding and agreement of plan. No other needs identified at this time, but she has my direct office number should she or the patient have any additional questions/concerns.  ?

## 2021-10-28 DIAGNOSIS — C760 Malignant neoplasm of head, face and neck: Secondary | ICD-10-CM | POA: Diagnosis not present

## 2021-10-28 DIAGNOSIS — C770 Secondary and unspecified malignant neoplasm of lymph nodes of head, face and neck: Secondary | ICD-10-CM | POA: Diagnosis not present

## 2021-10-28 DIAGNOSIS — F419 Anxiety disorder, unspecified: Secondary | ICD-10-CM | POA: Diagnosis not present

## 2021-10-31 NOTE — Progress Notes (Signed)
Head and Neck Cancer Location of Tumor / Histology:  ?Squamous cell carcinoma of the oropharynx, p16(+) ? ?Biopsies revealed:  ?10/10/2021 ?FINAL MICROSCOPIC DIAGNOSIS:  ?A. LEFT PYRIFORM SINUS, BIOPSY:  ?- Tonsillar tissue with mild atypia, see comment  ?- Negative for carcinoma  ?B. VALLECULA, LEFT, BIOPSY:  ?- Benign tonsillar tissue  ?- Negative for carcinoma  ?C. TONGUE, LEFT BASE, BIOPSY:  ?- Benign squamous mucosa with underlying tonsillar tissue  ?- Negative for carcinoma  ?D. NASOPHARYNX, LEFT, BIOPSY:  ?- Benign respiratory mucosa  ?- Negative for carcinoma  ?E. POSTERIOLATERAL, LEFT, NECK DISSECTION:  ?- Metastatic well to moderately differentiated keratinizing squamous cell carcinoma involving 1 level 5 lymph node out of a total of 24 lymph nodes (1/24)  ?- Metastatic carcinoma measures 2.2 cm in greatest dimension and shows focal evidence of extranodal extension  ?ADDENDUM:  ?At the clinician's request immunohistochemical stains for the HPV surrogate marker p16 and in situ hybridization for EBER are performed on this squamous cell carcinoma with adequate control.  The tumor is positive for p16.  The tumor is negative by EBER.  ? ?Nutrition Status Yes No Comments  ?Weight changes? '[x]'$  '[]'$  Reports small unintentional weight loss due to lack of taste/appetite since his surgery  ?Swallowing concerns? '[]'$  '[x]'$    ?PEG? '[]'$  '[x]'$    ? ?Referrals Yes No Comments  ?Social Work? '[x]'$  '[]'$    ?Dentistry? '[x]'$  '[]'$  Dr. Darene Lamer. [place] 11/07/2021  ?Swallowing therapy? '[x]'$  '[]'$    ?Nutrition? '[x]'$  '[]'$    ?Med/Onc? '[]'$  '[x]'$    ? ?Safety Issues Yes No Comments  ?Prior radiation? '[]'$  '[x]'$    ?Pacemaker/ICD? '[]'$  '[x]'$    ?Possible current pregnancy? '[]'$  '[x]'$  N/A  ?Is the patient on methotrexate? '[]'$  '[x]'$    ? ?Tobacco/Marijuana/Snuff/ETOH use: Reports smoking occasional cigarettes; 2-3 a day (has been using nicotine patches for the past 4 weeks to try and quit). Occasionally drinks alcohol. Denies any recreational drug use ? ?Past/Anticipated interventions by  otolaryngology, if any:  ?10/10/2021 ?--Dr. Izora Gala ?DIRECT LARYNGOSCOPY WITH MULTIPLE BIOPSIES ?NASOPHARYNGOSCOPY ?MODIFIED RADICAL NECK DISSECTION ?ESOPHAGOSCOPY ? ?Past/Anticipated interventions by medical oncology, if any:  ?No referral placed at this time ? ? ?Current Complaints / other details:  Had an abcess above left front teeth and up into his cheek after surgery; reports the build up of pressure caused it to pop and drain on its own; felt immediate relief.  Took left over antibiotics from previous dental procedure (believes it was amoxicillin), and states issue hasn't recurred  ?

## 2021-10-31 NOTE — Progress Notes (Signed)
?Radiation Oncology         [phone] ?________________________________ ? ?Outpatient Re-Consultation ? ?Name: Richard Terry MRN: [id number]  ?Date: 11/01/2021  DOB: 11-02-1960 ? ?CC:Sun, Gari Crown, MD  Izora Gala, MD  ? ?REFERRING PHYSICIAN: Izora Gala, MD ? ?DIAGNOSIS: C77.0 ? ?  ICD-10-CM   ?1. Head and neck cancer ([place])  C76.0   ?  ?2. Secondary and unspecified malignant neoplasm of lymph nodes of head, face and neck (HCC)  C77.0   ?  ? ? Cancer Staging  ?Secondary and unspecified malignant neoplasm of lymph nodes of head, face and neck ([place]) ?Staging form: Cutaneous Carcinoma of the Head and Neck, AJCC 8th Edition ?- Clinical stage from 11/02/2021: Stage IV (cTX, cN3b, cM0) - Signed by Eppie Gibson, MD on 11/02/2021 ?Presence of extranodal extension: Present ?Extraosseous extension: Unknown ?  ? ?Left level 5 neck mass - Squamous cell carcinoma  ?  ?CHIEF COMPLAINT: Here to discuss management of oropharyngeal cancer ? ?Narrative / Interval History 10/18/21: The patient returns today for re-evaluation. To review from our last visit, given his smoking/ETOH history, we discussed the possibility of his disease being a primary throat cancer, vs more likely skin primary given his skin cancer hx. Subsequently, I recommend the patient to meet with Dr. Constance Holster for consideration of left neck dissection and targeted biopsies of the mucosal axis of the throat, and also for skin inspection. We discussed how the role of XRT would be dependant on his primary. ? ?Accordingly, the patient met with Dr. Constance Holster and proceeded to undergo left neck dissection on 10/10/21. Pathology from the procedure revealed findings consistent with metastatic well to moderately differentiated keratinizing squamous  ?cell carcinoma involving 1 level 5 lymph node out of a total of 24 examined lymph nodes, measuring 2.2 cm in the greatest dimension, and with focal evidence of extranodal extension. Dr. Constance Holster also collected pharyngeal biopsies (left  pyriform sinus, left vallecula, and left tongue base) which were all negative for carcinoma. IHC showed p16 positive, EBER negative.   ? ?During his most recent post-op follow up with Dr. Constance Holster on 10/18/21, the patient was noted to be healing well.   ? ?He enjoyed his cruise with his family.  He is healing well from surgery.  He is mowing his own lawn but has not yet returned to work.  He expresses a desire to de-escalate his treatment if possible to avoid side effects. ? ? ?Nutrition Status Yes No Comments  ?Weight changes? _0  _1  Mild unintentional weight loss due to decrease in appetite  ?Swallowing concerns? _2  _3    ?PEG? _4  _5    ? ?Referrals Yes No Comments  ?Social Work? _6  _7    ?Dentistry? _8  _9    ?Swallowing therapy? _10  _11    ?Nutrition? _12  _13    ?Med/Onc? _14  _15    ? ?Safety Issues Yes No Comments  ?Prior radiation? _16  _17    ?Pacemaker/ICD? _18  _19    ?Possible current pregnancy? _20  _21  N/A  ?Is the patient on methotrexate? _22  _23    ? ?PREVIOUS RADIATION THERAPY: No ? ?PAST MEDICAL HISTORY:  has a past medical history of Anxiety, Arthritis, Cancer ([place]), Cough, and Right eye injury.   ? ?PAST SURGICAL HISTORY: ?Past Surgical History:  ?Procedure Laterality Date  ? ANTERIOR CERVICAL DECOMP/DISCECTOMY FUSION N/A 10/22/2012  ? Procedure: ANTERIOR CERVICAL DECOMPRESSION/DISCECTOMY FUSION 3 LEVELS;  Surgeon: Otilio Connors, MD;  Location: [facility];  Service: Neurosurgery;  Laterality: N/A;  Cervical four-five,five-six,six-seven Anterior cervical decompression/diskectomy/fusion/allograft/trestle plate  ? ESOPHAGOSCOPY N/A 10/10/2021  ?  Procedure: ESOPHAGOSCOPY;  Surgeon: Izora Gala, MD;  Location: [place];  Service: ENT;  Laterality: N/A;  ? EYE SURGERY    ? LARYNGOSCOPY AND ESOPHAGOSCOPY N/A 10/10/2021  ? Procedure: DIRECT LARYNGOSCOPY WITH MULTIPLE BIOPSIES;  Surgeon: Izora Gala, MD;  Location: [place];  Service: ENT;  Laterality: N/A;  ? NASOPHARYNGOSCOPY N/A 10/10/2021  ? Procedure: NASOPHARYNGOSCOPY;   Surgeon: Izora Gala, MD;  Location: [place];  Service: ENT;  Laterality: N/A;  ? RADICAL NECK DISSECTION Left 10/10/2021  ? Procedure: MODIFIED RADICAL NECK DISSECTION;  Surgeon: Izora Gala, MD;  Location: [place];  Service: ENT;  Laterality: Left;  ? SHOULDER SURGERY    ? ? ?FAMILY HISTORY: family history includes Bladder Cancer in his mother. Family history noted by Dr. Benjamine Mola includes heart disease, stroke, and stomach cancer (family members not specified).  ? ?SOCIAL HISTORY:  reports that he has been smoking cigarettes. He has never used smokeless tobacco. He reports current alcohol use of about 50.0 standard drinks per week. He reports that he does not use drugs. ? ?ALLERGIES: Bupropion ? ?MEDICATIONS:  ?Current Outpatient Medications  ?Medication Sig Dispense Refill  ? acetaminophen (TYLENOL) 500 MG tablet Take 1,000 mg by mouth every 6 (six) hours as needed for moderate pain.    ? albuterol (VENTOLIN HFA) 108 (90 Base) MCG/ACT inhaler Inhale 1-2 puffs into the lungs every 6 (six) hours as needed for wheezing or shortness of breath.    ? calcium elemental as carbonate (TUMS ULTRA 1000) 400 MG chewable tablet Chew 1,000-2,000 mg by mouth 2 (two) times daily as needed for heartburn.    ? Carboxymethylcellul-Glycerin (LUBRICATING EYE DROPS OP) Place 1 drop into both eyes 2 (two) times daily as needed (dry eyes).    ? diclofenac Sodium (VOLTAREN) 1 % GEL Apply 1 application. topically 4 (four) times daily as needed (pain).    ? diphenhydramine-acetaminophen (TYLENOL PM) 25-500 MG TABS tablet Take 1 tablet by mouth at bedtime as needed (sleep).    ? escitalopram (LEXAPRO) 20 MG tablet Take 20 mg by mouth daily.    ? HYDROcodone-acetaminophen (NORCO) 7.5-325 MG tablet Take 1 tablet by mouth every 6 (six) hours as needed for moderate pain. 20 tablet 0  ? Multiple Vitamin (MULTIVITAMIN WITH MINERALS) TABS tablet Take 1 tablet by mouth daily.    ? nicotine (NICODERM CQ - DOSED IN MG/24 HOURS) 14 mg/24hr patch Place 1  patch (14 mg total) onto the skin daily. Apply 21 mg patch daily x 6 wk, then 23m patch daily x 2 wk, then 7 mg patch daily x 2 wk 14 patch 0  ? nicotine (NICODERM CQ - DOSED IN MG/24 HOURS) 21 mg/24hr patch Place 1 patch (21 mg total) onto the skin daily. Apply 21 mg patch daily x 6 wk, then 136mpatch daily x 2 wk, then 7 mg patch daily x 2 wk 14 patch 2  ? nicotine (NICODERM CQ - DOSED IN MG/24 HR) 7 mg/24hr patch Place 1 patch (7 mg total) onto the skin daily. Apply 21 mg patch daily x 6 wk, then 1475match daily x 2 wk, then 7 mg patch daily x 2 wk 14 patch 0  ? ondansetron (ZOFRAN-ODT) 4 MG disintegrating tablet Take 1 tablet (4 mg total) by mouth every 8 (eight) hours as needed for nausea or vomiting. 20 tablet 0  ? ?No current facility-administered medications for this encounter.  ? ? ?REVIEW OF SYSTEMS:  Notable for that above. ?  ?PHYSICAL EXAM:  height  is _0  (1.88 m) and weight is 148 lb 2 oz (67.2 kg). His temporal temperature is 97.1 ?F (36.2 ?C) (abnormal). His blood pressure is 138/84 and his pulse is 71. His respiration is 18 and oxygen saturation is 100%.   ?General: Alert and oriented, in no acute distress  ?HEENT: Head is normocephalic. Extraocular movements are intact.   ?Neck: Left neck healing well from surgical dissection with no palpable cervical or supraclavicular lymphadenopathy. ?Neurologic: Cranial nerves II through XII are grossly intact. No obvious focalities. Speech is fluent. Coordination is intact. ?Psychiatric: Judgment and insight are intact. Affect is appropriate. ? ? ?ECOG = 0 ? ?0 - Asymptomatic (Fully active, able to carry on all predisease activities without restriction) ? ?1 - Symptomatic but completely ambulatory (Restricted in physically strenuous activity but ambulatory and able to carry out work of a light or sedentary nature. For example, light housework, office work) ? ?2 - Symptomatic, <50% in bed during the day (Ambulatory and capable of all self care but unable  to carry out any work activities. Up and about more than 50% of waking hours) ? ?3 - Symptomatic, >50% in bed, but not bedbound (Capable of only limited self-care, confined to bed or chair 50% or more of waking

## 2021-11-01 ENCOUNTER — Other Ambulatory Visit: Payer: Self-pay

## 2021-11-01 ENCOUNTER — Encounter: Payer: Self-pay | Admitting: Radiation Oncology

## 2021-11-01 ENCOUNTER — Ambulatory Visit
Admission: RE | Admit: 2021-11-01 | Discharge: 2021-11-01 | Disposition: A | Discharge status: Home / Self Care | Payer: BC Managed Care – PPO | Source: Ambulatory Visit | Attending: Radiation Oncology | Admitting: Radiation Oncology

## 2021-11-01 VITALS — BP 138/84 | HR 71 | Temp 97.1°F | Resp 18 | Ht 74.0 in | Wt 148.1 lb

## 2021-11-01 DIAGNOSIS — Z79899 Other long term (current) drug therapy: Secondary | ICD-10-CM | POA: Insufficient documentation

## 2021-11-01 DIAGNOSIS — C770 Secondary and unspecified malignant neoplasm of lymph nodes of head, face and neck: Secondary | ICD-10-CM | POA: Diagnosis not present

## 2021-11-01 DIAGNOSIS — F17210 Nicotine dependence, cigarettes, uncomplicated: Secondary | ICD-10-CM | POA: Diagnosis not present

## 2021-11-01 DIAGNOSIS — C760 Malignant neoplasm of head, face and neck: Secondary | ICD-10-CM

## 2021-11-01 NOTE — Progress Notes (Signed)
Oncology Nurse Navigator Documentation  ? ?Met with patient during reconsult with Dr. Isidore Moos. He was accompanied by his wife.  ?Further introduced myself as his/their Navigator, explained my role as a member of the Care Team. ?They know that Dr. Isidore Moos will discuss his case with pathology and Dr. Constance Holster in ENT conference tomorrow.  Dr. Isidore Moos or I will call with the information discussed.  ?They verbalized understanding of information provided. ?I encouraged them to call with questions/concerns moving forward. ? ?Harlow Asa, RN, BSN, OCN ?Head & Neck Oncology Nurse Navigator ?[facility] ?[phone]  ?

## 2021-11-02 ENCOUNTER — Encounter: Payer: Self-pay | Admitting: Radiation Oncology

## 2021-11-07 ENCOUNTER — Other Ambulatory Visit: Payer: Self-pay

## 2021-11-07 ENCOUNTER — Telehealth: Payer: Self-pay | Admitting: *Deleted

## 2021-11-07 DIAGNOSIS — C770 Secondary and unspecified malignant neoplasm of lymph nodes of head, face and neck: Secondary | ICD-10-CM

## 2021-11-07 NOTE — Telephone Encounter (Signed)
CALLED PATIENT TO ASK ABOUT GETTING A LAB ON Friday  April 28, SPOKE WITH PATIENT'S WIFE- CHERYL AND SHE AGREED FOR HER HUSBAND TO GO ON 11-11-21 @ 4:15 PM ?

## 2021-11-09 ENCOUNTER — Other Ambulatory Visit: Payer: Self-pay

## 2021-11-09 DIAGNOSIS — C770 Secondary and unspecified malignant neoplasm of lymph nodes of head, face and neck: Secondary | ICD-10-CM

## 2021-11-10 ENCOUNTER — Telehealth: Payer: Self-pay | Admitting: Licensed Clinical Social Worker

## 2021-11-10 ENCOUNTER — Telehealth: Payer: Self-pay | Admitting: Radiation Oncology

## 2021-11-10 NOTE — Telephone Encounter (Signed)
[facility] ?Clinical Social Work ? ?Clinical Social Work was referred by nurse navigator for assessment of psychosocial needs in new H&N pt.  Clinical Social Worker  attempted to contact pt by phone   to offer support and assess for needs.   No answer. Left VM with direct contact information. ? ? ? ? ? ?Richard Terry E Lurlie Wigen, LCSW  ?Clinical Social Worker ?[facility] ?      ? ?

## 2021-11-10 NOTE — Telephone Encounter (Signed)
.  Called patient to schedule appointment per 4/26 inbasket, left pt message ?

## 2021-11-11 ENCOUNTER — Ambulatory Visit: Payer: BC Managed Care – PPO

## 2021-11-11 ENCOUNTER — Ambulatory Visit
Admission: RE | Admit: 2021-11-11 | Discharge: 2021-11-11 | Disposition: A | Discharge status: Home / Self Care | Payer: BC Managed Care – PPO | Source: Ambulatory Visit | Attending: Radiation Oncology | Admitting: Radiation Oncology

## 2021-11-11 ENCOUNTER — Other Ambulatory Visit: Payer: Self-pay

## 2021-11-11 DIAGNOSIS — C770 Secondary and unspecified malignant neoplasm of lymph nodes of head, face and neck: Secondary | ICD-10-CM | POA: Insufficient documentation

## 2021-11-11 DIAGNOSIS — C760 Malignant neoplasm of head, face and neck: Secondary | ICD-10-CM | POA: Insufficient documentation

## 2021-11-14 ENCOUNTER — Inpatient Hospital Stay: Payer: BC Managed Care – PPO | Admitting: Nutrition

## 2021-11-15 DIAGNOSIS — C760 Malignant neoplasm of head, face and neck: Secondary | ICD-10-CM | POA: Diagnosis not present

## 2021-11-15 DIAGNOSIS — C770 Secondary and unspecified malignant neoplasm of lymph nodes of head, face and neck: Secondary | ICD-10-CM | POA: Insufficient documentation

## 2021-11-16 DIAGNOSIS — C760 Malignant neoplasm of head, face and neck: Secondary | ICD-10-CM | POA: Diagnosis not present

## 2021-11-16 DIAGNOSIS — C770 Secondary and unspecified malignant neoplasm of lymph nodes of head, face and neck: Secondary | ICD-10-CM | POA: Diagnosis not present

## 2021-11-21 ENCOUNTER — Ambulatory Visit: Payer: BC Managed Care – PPO | Admitting: Radiation Oncology

## 2021-11-21 ENCOUNTER — Ambulatory Visit: Payer: BC Managed Care – PPO

## 2021-11-22 ENCOUNTER — Ambulatory Visit
Admission: RE | Admit: 2021-11-22 | Payer: BC Managed Care – PPO | Source: Ambulatory Visit | Admitting: Radiation Oncology

## 2021-11-22 ENCOUNTER — Other Ambulatory Visit: Payer: Self-pay

## 2021-11-22 ENCOUNTER — Other Ambulatory Visit: Payer: Self-pay | Admitting: Radiation Oncology

## 2021-11-22 ENCOUNTER — Ambulatory Visit: Payer: BC Managed Care – PPO

## 2021-11-22 ENCOUNTER — Ambulatory Visit
Admission: RE | Admit: 2021-11-22 | Discharge: 2021-11-22 | Disposition: A | Discharge status: Home / Self Care | Payer: BC Managed Care – PPO | Source: Ambulatory Visit | Attending: Radiation Oncology | Admitting: Radiation Oncology

## 2021-11-22 ENCOUNTER — Ambulatory Visit: Payer: BC Managed Care – PPO | Admitting: Radiation Oncology

## 2021-11-22 DIAGNOSIS — C770 Secondary and unspecified malignant neoplasm of lymph nodes of head, face and neck: Secondary | ICD-10-CM

## 2021-11-22 DIAGNOSIS — C760 Malignant neoplasm of head, face and neck: Secondary | ICD-10-CM

## 2021-11-22 MED ORDER — LORAZEPAM 0.5 MG PO TABS: ORAL_TABLET | ORAL | 0 refills | Status: DC

## 2021-11-23 ENCOUNTER — Other Ambulatory Visit: Payer: Self-pay

## 2021-11-23 ENCOUNTER — Ambulatory Visit
Admission: RE | Admit: 2021-11-23 | Discharge: 2021-11-23 | Disposition: A | Discharge status: Home / Self Care | Payer: BC Managed Care – PPO | Source: Ambulatory Visit | Attending: Radiation Oncology | Admitting: Radiation Oncology

## 2021-11-23 DIAGNOSIS — C760 Malignant neoplasm of head, face and neck: Secondary | ICD-10-CM | POA: Diagnosis not present

## 2021-11-23 DIAGNOSIS — C770 Secondary and unspecified malignant neoplasm of lymph nodes of head, face and neck: Secondary | ICD-10-CM | POA: Diagnosis not present

## 2021-11-23 DIAGNOSIS — Z510 Encounter for antineoplastic radiation therapy: Secondary | ICD-10-CM | POA: Diagnosis not present

## 2021-11-23 LAB — RAD ONC ARIA SESSION SUMMARY
Course Elapsed Days: 0
Plan Fractions Treated to Date: 1
Plan Prescribed Dose Per Fraction: 2 Gy
Plan Total Fractions Prescribed: 30
Plan Total Prescribed Dose: 60 Gy
Reference Point Dosage Given to Date: 2 Gy
Reference Point Session Dosage Given: 2 Gy
Session Number: 1

## 2021-11-23 LAB — TSH: TSH: 2.61 u[IU]/mL (ref 0.350–4.500)

## 2021-11-24 ENCOUNTER — Inpatient Hospital Stay: Payer: BC Managed Care – PPO | Admitting: Nutrition

## 2021-11-24 ENCOUNTER — Other Ambulatory Visit: Payer: Self-pay

## 2021-11-24 ENCOUNTER — Encounter: Payer: Self-pay | Admitting: Nutrition

## 2021-11-24 ENCOUNTER — Ambulatory Visit
Admission: RE | Admit: 2021-11-24 | Discharge: 2021-11-24 | Disposition: A | Discharge status: Home / Self Care | Payer: BC Managed Care – PPO | Source: Ambulatory Visit | Attending: Radiation Oncology | Admitting: Radiation Oncology

## 2021-11-24 DIAGNOSIS — C770 Secondary and unspecified malignant neoplasm of lymph nodes of head, face and neck: Secondary | ICD-10-CM | POA: Diagnosis not present

## 2021-11-24 DIAGNOSIS — Z510 Encounter for antineoplastic radiation therapy: Secondary | ICD-10-CM | POA: Diagnosis not present

## 2021-11-24 DIAGNOSIS — C760 Malignant neoplasm of head, face and neck: Secondary | ICD-10-CM | POA: Diagnosis not present

## 2021-11-24 LAB — RAD ONC ARIA SESSION SUMMARY
Course Elapsed Days: 1
Plan Fractions Treated to Date: 2
Plan Prescribed Dose Per Fraction: 2 Gy
Plan Total Fractions Prescribed: 30
Plan Total Prescribed Dose: 60 Gy
Reference Point Dosage Given to Date: 4 Gy
Reference Point Session Dosage Given: 2 Gy
Session Number: 2

## 2021-11-24 NOTE — Progress Notes (Signed)
Patient cancelled nutrition appointment. 

## 2021-11-25 ENCOUNTER — Other Ambulatory Visit: Payer: Self-pay

## 2021-11-25 ENCOUNTER — Ambulatory Visit
Admission: RE | Admit: 2021-11-25 | Discharge: 2021-11-25 | Disposition: A | Discharge status: Home / Self Care | Payer: BC Managed Care – PPO | Source: Ambulatory Visit | Attending: Radiation Oncology | Admitting: Radiation Oncology

## 2021-11-25 DIAGNOSIS — C770 Secondary and unspecified malignant neoplasm of lymph nodes of head, face and neck: Secondary | ICD-10-CM | POA: Diagnosis not present

## 2021-11-25 DIAGNOSIS — C760 Malignant neoplasm of head, face and neck: Secondary | ICD-10-CM | POA: Diagnosis not present

## 2021-11-25 DIAGNOSIS — Z510 Encounter for antineoplastic radiation therapy: Secondary | ICD-10-CM | POA: Diagnosis not present

## 2021-11-25 LAB — RAD ONC ARIA SESSION SUMMARY
Course Elapsed Days: 2
Plan Fractions Treated to Date: 3
Plan Prescribed Dose Per Fraction: 2 Gy
Plan Total Fractions Prescribed: 30
Plan Total Prescribed Dose: 60 Gy
Reference Point Dosage Given to Date: 6 Gy
Reference Point Session Dosage Given: 2 Gy
Session Number: 3

## 2021-11-28 ENCOUNTER — Other Ambulatory Visit: Payer: Self-pay

## 2021-11-28 ENCOUNTER — Ambulatory Visit
Admission: RE | Admit: 2021-11-28 | Discharge: 2021-11-28 | Disposition: A | Discharge status: Home / Self Care | Payer: BC Managed Care – PPO | Source: Ambulatory Visit | Attending: Radiation Oncology | Admitting: Radiation Oncology

## 2021-11-28 DIAGNOSIS — Z510 Encounter for antineoplastic radiation therapy: Secondary | ICD-10-CM | POA: Diagnosis not present

## 2021-11-28 DIAGNOSIS — C770 Secondary and unspecified malignant neoplasm of lymph nodes of head, face and neck: Secondary | ICD-10-CM | POA: Diagnosis not present

## 2021-11-28 DIAGNOSIS — C760 Malignant neoplasm of head, face and neck: Secondary | ICD-10-CM | POA: Diagnosis not present

## 2021-11-28 LAB — RAD ONC ARIA SESSION SUMMARY
Course Elapsed Days: 5
Plan Fractions Treated to Date: 4
Plan Prescribed Dose Per Fraction: 2 Gy
Plan Total Fractions Prescribed: 30
Plan Total Prescribed Dose: 60 Gy
Reference Point Dosage Given to Date: 8 Gy
Reference Point Session Dosage Given: 2 Gy
Session Number: 4

## 2021-11-28 MED ORDER — SONAFINE EX EMUL: 1.0000 "application " | Freq: Two times a day (BID) | CUTANEOUS | Status: DC

## 2021-11-28 NOTE — Progress Notes (Signed)
Pt here for patient teaching. Pt given Radiation and You booklet, Managing Acute Radiation Side Effects for Head and Neck Cancer handout, skin care instructions, and Sonafine.  Reviewed areas of pertinence such as fatigue, hair loss, mouth changes, nausea and vomiting, skin changes, throat changes, headache, earaches, and taste changes. Pt able to give teach back of to pat skin, use unscented/gentle soap, and drink plenty of water, apply Sonafine bid, avoid applying anything to skin within 4 hours of treatment, and to use an electric razor if they must shave. Pt verbalizes understanding of information given and will contact nursing with any questions or concerns.     [email or web address]      

## 2021-11-29 ENCOUNTER — Other Ambulatory Visit: Payer: Self-pay

## 2021-11-29 ENCOUNTER — Ambulatory Visit
Admission: RE | Admit: 2021-11-29 | Discharge: 2021-11-29 | Disposition: A | Discharge status: Home / Self Care | Payer: BC Managed Care – PPO | Source: Ambulatory Visit | Attending: Radiation Oncology | Admitting: Radiation Oncology

## 2021-11-29 ENCOUNTER — Inpatient Hospital Stay: Payer: BC Managed Care – PPO | Attending: Radiation Oncology | Admitting: Dietician

## 2021-11-29 DIAGNOSIS — Z510 Encounter for antineoplastic radiation therapy: Secondary | ICD-10-CM | POA: Diagnosis not present

## 2021-11-29 DIAGNOSIS — C760 Malignant neoplasm of head, face and neck: Secondary | ICD-10-CM | POA: Diagnosis not present

## 2021-11-29 DIAGNOSIS — C770 Secondary and unspecified malignant neoplasm of lymph nodes of head, face and neck: Secondary | ICD-10-CM | POA: Diagnosis not present

## 2021-11-29 LAB — RAD ONC ARIA SESSION SUMMARY
Course Elapsed Days: 6
Plan Fractions Treated to Date: 5
Plan Prescribed Dose Per Fraction: 2 Gy
Plan Total Fractions Prescribed: 30
Plan Total Prescribed Dose: 60 Gy
Reference Point Dosage Given to Date: 10 Gy
Reference Point Session Dosage Given: 2 Gy
Session Number: 5

## 2021-11-29 NOTE — Progress Notes (Signed)
Nutrition Assessment ? ? ?Reason for Assessment: Head & Neck ? ? ?ASSESSMENT: 61 year old male with oropharyngeal cancer. S/p left neck dissection on 3/27. He is receiving daily radiation therapy to left next only. Patient is under the care of Dr. Isidore Moos.  ? ?Past medical history includes anxiety, arthritis, right eye injury, tobacco use, EtOH abuse.  ? ?Met with patient in office following radiation. Patient denies sore throat, dysphagia, altered taste. He reports mild thickened saliva in throat. 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 Patient reports does not eat much at baseline. Typically drinks a protein drink (42 g protein, unsure of kcal) in the morning as well as another one late afternoon, biscuit for lunch (bacon/egg/cheese), bowl of cereal for dinner. Last night he had a bowl of chili. Patient is drinking 1 bottle of water. He gets a "big sweet tea" at lunch time. Patient reports he is working on smoking cessation, recalls ~10 cigarettes down from 30/day since hospital discharge. He is wearing the patch. He continues to drink alcohol, reports 8-10 (12 ounce) beers nightly. Patient reports this is why he eats cereal for dinner because he begins drinking when he gets home from work and is full. Sometimes he will eat ice cream in the middle of the night.  ? ? ?Nutrition Focused Physical Exam:  ? ?Orbital Region: moderate ?Buccal Region: moderate ?Upper Arm Region: UTA ?Thoracic and Lumbar Region: UTA ?Temple Region: mild ?Clavicle Bone Region: mild ?Shoulder and Acromion Bone Region: mild ?Scapular Bone Region: UTA ?Dorsal Hand: no depletion  ?Patellar Region: UTA ?Anterior Thigh Region: UTA ?Posterior Calf Region: UTA ?Hair: reviewed  ?Eyes: reviewed (prosthetic right eye) ?Mouth: reviewed ?Skin: reviewed (sun damage) ?Nails: reviewed ? ? ?Medications: ativan, norco, zofran, MVI, nicoderm, lexapro ? ? ?Labs: 3/23 - glucose 161 ? ? ?Anthropometrics:  ? ?Height: 6'2" ?Weight: 150.2 lb (5/15) ?UBW:  151.5 lb (2/15) ?BMI: 19.02 ? ? ?Estimated Energy Needs ? ?Kcals: 3546-5681 ?Protein: 109-123 ?Fluid: 2.5 L ? ? ?NUTRITION DIAGNOSIS: Inadequate oral intake related to cancer and associated treatment as evidenced by dietary recall  ? ? ? ?INTERVENTION:  ?Educated on importance of adequate intake of calories and protein to maintain weights/strength ?Discussed strategies for increasing calories and protein with small frequent meals and snacks - shake recipes provided ?Encouraged soft smooth high protein foods with increased throat soreness - handout with ideas provided ?Discussed empty calories in alcohol, encouraged working to decrease daily consumption and have recommend daily MVI, B1, B-complex (pt agreeable, written instructions provided) ?Continue drinking protein shakes, suggested switching to Ensure Complete/equivalent for added calories - samples and coupons provided ?Discussed strategies for dry mouth/thick saliva, recommend baking soda salt water rinses several times daily - handout with tips and recipe provided ?Patient will work to increase intake of water ?Contact information provided  ? ? ?MONITORING, EVALUATION, GOAL: Patient will tolerate increased calories and protein to minimize weight loss  ? ? ?Next Visit: Tuesday May 23 with Pamala Hurry ? ? ? ? ? ? ?

## 2021-11-30 ENCOUNTER — Encounter: Payer: Self-pay | Admitting: Physical Therapy

## 2021-11-30 ENCOUNTER — Ambulatory Visit
Admission: RE | Admit: 2021-11-30 | Discharge: 2021-11-30 | Disposition: A | Discharge status: Home / Self Care | Payer: BC Managed Care – PPO | Source: Ambulatory Visit | Attending: Radiation Oncology | Admitting: Radiation Oncology

## 2021-11-30 ENCOUNTER — Other Ambulatory Visit: Payer: Self-pay

## 2021-11-30 ENCOUNTER — Ambulatory Visit: Payer: BC Managed Care – PPO | Attending: Radiation Oncology | Admitting: Physical Therapy

## 2021-11-30 DIAGNOSIS — R293 Abnormal posture: Secondary | ICD-10-CM | POA: Insufficient documentation

## 2021-11-30 DIAGNOSIS — Z510 Encounter for antineoplastic radiation therapy: Secondary | ICD-10-CM | POA: Diagnosis not present

## 2021-11-30 DIAGNOSIS — C760 Malignant neoplasm of head, face and neck: Secondary | ICD-10-CM | POA: Diagnosis not present

## 2021-11-30 DIAGNOSIS — C770 Secondary and unspecified malignant neoplasm of lymph nodes of head, face and neck: Secondary | ICD-10-CM

## 2021-11-30 LAB — RAD ONC ARIA SESSION SUMMARY
Course Elapsed Days: 7
Plan Fractions Treated to Date: 6
Plan Prescribed Dose Per Fraction: 2 Gy
Plan Total Fractions Prescribed: 30
Plan Total Prescribed Dose: 60 Gy
Reference Point Dosage Given to Date: 12 Gy
Reference Point Session Dosage Given: 2 Gy
Session Number: 6

## 2021-11-30 NOTE — Therapy (Signed)
?OUTPATIENT PHYSICAL THERAPY HEAD AND NECK BASELINE EVALUATION ? ? ?Patient Name: Richard Terry ?MRN: [id number] ?DOB:1961/02/06, 60 y.o., male ?Today's Date: 11/30/2021 ? ? ? ?Past Medical History:  ?Diagnosis Date  ? Anxiety   ? Arthritis   ? Cancer [facility])   ? Cough   ? Right eye injury   ? removed with prosthesis  ? ?Past Surgical History:  ?Procedure Laterality Date  ? ANTERIOR CERVICAL DECOMP/DISCECTOMY FUSION N/A 10/22/2012  ? Procedure: ANTERIOR CERVICAL DECOMPRESSION/DISCECTOMY FUSION 3 LEVELS;  Surgeon: Otilio Connors, MD;  Location: [place] [facility];  Service: Neurosurgery;  Laterality: N/A;  Cervical four-five,five-six,six-seven Anterior cervical decompression/diskectomy/fusion/allograft/trestle plate  ? ESOPHAGOSCOPY N/A 10/10/2021  ? Procedure: ESOPHAGOSCOPY;  Surgeon: Izora Gala, MD;  Location: [place];  Service: ENT;  Laterality: N/A;  ? EYE SURGERY    ? LARYNGOSCOPY AND ESOPHAGOSCOPY N/A 10/10/2021  ? Procedure: DIRECT LARYNGOSCOPY WITH MULTIPLE BIOPSIES;  Surgeon: Izora Gala, MD;  Location: [place];  Service: ENT;  Laterality: N/A;  ? NASOPHARYNGOSCOPY N/A 10/10/2021  ? Procedure: NASOPHARYNGOSCOPY;  Surgeon: Izora Gala, MD;  Location: [place];  Service: ENT;  Laterality: N/A;  ? RADICAL NECK DISSECTION Left 10/10/2021  ? Procedure: MODIFIED RADICAL NECK DISSECTION;  Surgeon: Izora Gala, MD;  Location: [place];  Service: ENT;  Laterality: Left;  ? SHOULDER SURGERY    ? ?Patient Active Problem List  ? Diagnosis Date Noted  ? 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 Secondary and unspecified malignant neoplasm of lymph nodes of head, face and neck ([place]) 09/02/2021  ? ? ?PCP: Donald Prose MD ? ?REFERRING PROVIDER: Eppie Gibson, MD ? 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 ?THERAPY DIAG:  ?No diagnosis found. ? ?ONSET DATE: 08/16/21 ? ?SUBJECTIVE    ?SUBJECTIVE STATEMENT: ?Patient reports they are here today to be seen by their medical team for her  newly diagnosed L neck squamous cell carcinoma cancer.  ? ?PERTINENT HISTORY:  ?Left neck SCC, unknown primary. 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 However, on further review at tumor board 08/31/21, there is a 2cm level 5 neck node appreciated without significant enhancement. 08/16/21 FNA of left neck revealed malignant cells consistent with SCC. 08/29/21 PET demonstrated an ill-defined hypermetabolic 16 x 13 mm left neck mass located between the sternocleidomastoid and trapezius muscles, likely corresponding with the biopsy-proven neoplasm. Otherwise, no evidence of hypermetabolic metastatic disease was appreciated within the neck, chest, abdomen, or pelvis. 08/31/21 Consult with Dr. Isidore Moos. At time of consult he was referred to Dr. Constance Holster to complete neck dissection and further biopsies (completed 10/10/21). He will receive 30 fractions of radiation to his Left neck. He started on 11/22/21 and will complete on 01/04/22. 10/10/21 Direct Laryngoscopy, nasopharyngoscopy, modified left neck dissection by Dr. Constance Holster.  ?PATIENT GOALS   to be educated about the signs and symptoms of lymphedema and learn post op HEP.  ? ?PAIN:  ?Are you having pain? No pt reports no pain just soreness in L neck post L neck dissection ? ? ?PRECAUTIONS: Active CA ? ?WEIGHT BEARING RESTRICTIONS No ? ?FALLS:  ?Has patient fallen in last 6 months? No ?Does the patient have a fear of falling that limits activity? No ?Is the patient reluctant to leave the house due to a fear of falling?No ? ?LIVING ENVIRONMENT: ?Patient  lives with: wife ?Lives in: House/apartment ?Has following equipment at home: None ? ?OCCUPATION: part time job doing landscaping ? ?LEISURE: pt does not exercise ? ?PRIOR LEVEL OF FUNCTION: Independent ? ? ?OBJECTIVE ? ?COGNITION: ?          Overall  cognitive status: Within functional limits for tasks assessed                 ? ?POSTURE:  ?Forward head and rounded shoulders posture ? ? 30 SEC SIT TO STAND: ?10 reps in 30 sec without use of UEs which is  Poorf or patient's age. Average is 15 for pt's age.  ? ? ?SHOULDER AROM:   WFL is WFL but pt has discomfort and some end range limitations, L shoulder is depressed compared to R ? ? ? ?CERVICAL AROM: ? ? Percent limited  ?Flexion WFL  ?Extension WFL  ?Right lateral flexion WFL  ?Left lateral flexion 25% limited  ?Right rotation 25% limited  ?Left rotation 25% limited  ?  (Blank rows=not tested) ? ? ?GAIT: ? Assessed: Yes ?Assistance needed: Independent ? Ambulation Distance: 10 feet ? Assistive Device: none  ?Gait pattern: WFL ?Ambulation surface: Level ? ?PATIENT EDUCATION:  ?Education details: Neck ROM, importance of posture when sitting, standing and lying down, deep breathing, walking program and importance of staying active throughout treatment, CURE article on staying active, "Why exercise?" flyer, lymphedema and PT info ?Person educated: Patient ?Education method: Explanation, Demonstration, Handout ?Education comprehension: Patient verbalized understanding and returned demonstration ? ? ?HOME EXERCISE PROGRAM: ?Patient was instructed today in a home exercise program today for head and neck range of motion exercises. These included active cervical flexion, active cervical extension, active cervical rotation to each direction, upper trap stretch, and shoulder retraction. Patient was encouraged to do these 2-3 times a day, holding for 5 sec each and completing for 5 reps. Pt was educated that once this becomes easier then hold the stretches for 30-60 seconds.  ? ? ?ASSESSMENT: ? ?CLINICAL IMPRESSION: ?Pt arrives to PT with recently diagnosed squamous cell carcinoma of left neck. Pt will undergo radiation to his left neck. Pt will start treatment on 11/22/21 and complete on 01/04/22.  Pt's cervical ROM was  limited due to recent L neck dissection. Educated pt about signs and symptoms of lymphedema as well as anatomy and physiology of lymphatic system. Educated pt in importance of staying as active as possible throughout treatment to decrease fatigue as well as head and neck ROM exercises to decrease loss of ROM. Will see pt after completion of radiation to reassess ROM and assess for lymphedema and to determine therapy needs at that time. ? ?Pt will benefit from skilled therapeutic intervention to improve on the following deficits: Decreased knowledge of precautions and postural dysfunction. Other deficits: decreased ROM ? ?PT treatment/interventions: ADL/self-care home management, pt/family education, therapeutic exercise. Other interventions Therapeutic exercises and Patient/Family education ? ?REHAB POTENTIAL: Good ? ?CLINICAL DECISION MAKING: Stable/uncomplicated ? ?EVALUATION COMPLEXITY: Low ? ? ?GOALS: ?Goals reviewed with patient? YES ? ?LONG TERM GOALS: (STG=LTG) ? ? Name Target Date ?(Remove Blue Hyperlink) Goal status  ?1 Patient will be able to verbalize understanding of a home exercise program for cervical range of motion, posture, and walking.   ?Baseline:  No knowledge 11/30/2021 Achieved at eval  ?2 Patient will be able to verbalize understanding of proper sitting and standing posture. ?Baseline:  No knowledge 11/30/2021 Achieved at eval  ?3 Patient will be able to verbalize understanding of lymphedema risk  and availability of treatment for this condition ?Baseline:  No knowledge 11/30/2021 Achieved at eval  ?4 Pt will demonstrate a return to full cervical ROM and function post operatively compared to baselines and not demonstrate any signs or symptoms of lymphedema.  ?Baseline: See objective measurements taken today. 01/25/2022 New  ? ? ? ?PLAN: ?PT FREQUENCY/DURATION: EVAL and 1 follow up appointment.  ? ?PLAN FOR NEXT SESSION: will reassess 2 weeks after completion of radiation to determine  needs. ? ?Patient will follow up at outpatient cancer rehab 2 weeks after completion of radiation.  If the patient requires physical therapy at that time, a specific plan will be dictated and sent to the referring physic

## 2021-12-01 ENCOUNTER — Other Ambulatory Visit: Payer: Self-pay

## 2021-12-01 ENCOUNTER — Ambulatory Visit: Payer: BC Managed Care – PPO | Admitting: Physical Therapy

## 2021-12-01 ENCOUNTER — Encounter: Payer: BC Managed Care – PPO | Admitting: Nutrition

## 2021-12-01 ENCOUNTER — Ambulatory Visit
Admission: RE | Admit: 2021-12-01 | Discharge: 2021-12-01 | Disposition: A | Discharge status: Home / Self Care | Payer: BC Managed Care – PPO | Source: Ambulatory Visit | Attending: Radiation Oncology | Admitting: Radiation Oncology

## 2021-12-01 DIAGNOSIS — Z510 Encounter for antineoplastic radiation therapy: Secondary | ICD-10-CM | POA: Diagnosis not present

## 2021-12-01 DIAGNOSIS — C760 Malignant neoplasm of head, face and neck: Secondary | ICD-10-CM | POA: Diagnosis not present

## 2021-12-01 DIAGNOSIS — C770 Secondary and unspecified malignant neoplasm of lymph nodes of head, face and neck: Secondary | ICD-10-CM | POA: Diagnosis not present

## 2021-12-01 LAB — RAD ONC ARIA SESSION SUMMARY
Course Elapsed Days: 8
Plan Fractions Treated to Date: 7
Plan Prescribed Dose Per Fraction: 2 Gy
Plan Total Fractions Prescribed: 30
Plan Total Prescribed Dose: 60 Gy
Reference Point Dosage Given to Date: 14 Gy
Reference Point Session Dosage Given: 2 Gy
Session Number: 7

## 2021-12-02 ENCOUNTER — Ambulatory Visit
Admission: RE | Admit: 2021-12-02 | Discharge: 2021-12-02 | Disposition: A | Discharge status: Home / Self Care | Payer: BC Managed Care – PPO | Source: Ambulatory Visit | Attending: Radiation Oncology | Admitting: Radiation Oncology

## 2021-12-02 ENCOUNTER — Other Ambulatory Visit: Payer: Self-pay

## 2021-12-02 DIAGNOSIS — Z510 Encounter for antineoplastic radiation therapy: Secondary | ICD-10-CM | POA: Diagnosis not present

## 2021-12-02 DIAGNOSIS — C760 Malignant neoplasm of head, face and neck: Secondary | ICD-10-CM | POA: Diagnosis not present

## 2021-12-02 DIAGNOSIS — C770 Secondary and unspecified malignant neoplasm of lymph nodes of head, face and neck: Secondary | ICD-10-CM | POA: Diagnosis not present

## 2021-12-02 LAB — RAD ONC ARIA SESSION SUMMARY
Course Elapsed Days: 9
Plan Fractions Treated to Date: 8
Plan Prescribed Dose Per Fraction: 2 Gy
Plan Total Fractions Prescribed: 30
Plan Total Prescribed Dose: 60 Gy
Reference Point Dosage Given to Date: 16 Gy
Reference Point Session Dosage Given: 2 Gy
Session Number: 8

## 2021-12-05 ENCOUNTER — Ambulatory Visit
Admission: RE | Admit: 2021-12-05 | Discharge: 2021-12-05 | Disposition: A | Discharge status: Home / Self Care | Payer: BC Managed Care – PPO | Source: Ambulatory Visit | Attending: Radiation Oncology | Admitting: Radiation Oncology

## 2021-12-05 ENCOUNTER — Encounter: Payer: BC Managed Care – PPO | Admitting: Nutrition

## 2021-12-05 ENCOUNTER — Other Ambulatory Visit: Payer: Self-pay

## 2021-12-05 DIAGNOSIS — Z510 Encounter for antineoplastic radiation therapy: Secondary | ICD-10-CM | POA: Diagnosis not present

## 2021-12-05 DIAGNOSIS — C760 Malignant neoplasm of head, face and neck: Secondary | ICD-10-CM | POA: Diagnosis not present

## 2021-12-05 DIAGNOSIS — C770 Secondary and unspecified malignant neoplasm of lymph nodes of head, face and neck: Secondary | ICD-10-CM | POA: Diagnosis not present

## 2021-12-05 LAB — RAD ONC ARIA SESSION SUMMARY
Course Elapsed Days: 12
Plan Fractions Treated to Date: 9
Plan Prescribed Dose Per Fraction: 2 Gy
Plan Total Fractions Prescribed: 30
Plan Total Prescribed Dose: 60 Gy
Reference Point Dosage Given to Date: 18 Gy
Reference Point Session Dosage Given: 2 Gy
Session Number: 9

## 2021-12-06 ENCOUNTER — Other Ambulatory Visit: Payer: Self-pay

## 2021-12-06 ENCOUNTER — Ambulatory Visit
Admission: RE | Admit: 2021-12-06 | Discharge: 2021-12-06 | Disposition: A | Discharge status: Home / Self Care | Payer: BC Managed Care – PPO | Source: Ambulatory Visit | Attending: Radiation Oncology | Admitting: Radiation Oncology

## 2021-12-06 ENCOUNTER — Inpatient Hospital Stay: Payer: BC Managed Care – PPO | Admitting: Nutrition

## 2021-12-06 DIAGNOSIS — C760 Malignant neoplasm of head, face and neck: Secondary | ICD-10-CM | POA: Diagnosis not present

## 2021-12-06 DIAGNOSIS — Z510 Encounter for antineoplastic radiation therapy: Secondary | ICD-10-CM | POA: Diagnosis not present

## 2021-12-06 DIAGNOSIS — C770 Secondary and unspecified malignant neoplasm of lymph nodes of head, face and neck: Secondary | ICD-10-CM | POA: Diagnosis not present

## 2021-12-06 LAB — RAD ONC ARIA SESSION SUMMARY
Course Elapsed Days: 13
Plan Fractions Treated to Date: 10
Plan Prescribed Dose Per Fraction: 2 Gy
Plan Total Fractions Prescribed: 30
Plan Total Prescribed Dose: 60 Gy
Reference Point Dosage Given to Date: 20 Gy
Reference Point Session Dosage Given: 2 Gy
Session Number: 10

## 2021-12-06 NOTE — Progress Notes (Signed)
Nutrition follow-up completed with patient prior to radiation therapy.  Today is his 10th treatment out of 30.  He has been diagnosed with oropharyngeal cancer and is status post left neck dissection on March 27.  He is only receiving radiation to the left side.  Patient does not have a feeding tube.  Weight is stable at 150.6 pounds.  He reports it is getting a little bit more difficult to swallow and he is noticing a bit more pain.  He reports thickened saliva.  States he has been taking his multivitamins.  So far he has been able to swallow these.  States he is trying to eat as much as he physically can because he wants to gain weight before he loses weight.  Reports he has tried to drink 2 Ensure Plus every day and really enjoys these.  Appears to still be drinking alcohol.  He admits to taste alterations with some foods and beverages tasting like metal.  He still sometimes skips lunch and has cereal for dinner.  Estimated nutrition needs: 2390-2595 cal, 109-123 g protein, 2.5 L fluid.  Nutrition diagnosis: Inadequate oral intake continues.  Intervention: Educated patient to continue high-calorie, high-protein foods throughout the day. Provided 1 complementary case of Ensure Plus high-protein and recommended patient continue twice daily. Increase fluid intake, especially since he is working out in the hot weather.   Enforced importance of him covering of his head and neck. Continue multivitamin, B1, B complex as previously directed until unable to swallow. Encouraged baking soda and salt water rinses for dry mouth/thick saliva.  Monitoring, evaluation, goals: Patient will work to continue adequate calorie and protein intake for weight maintenance.  Next visit:Tuesday, May 30 with Vinnie Level.  **Disclaimer: This note was dictated with voice recognition software. Similar sounding words can inadvertently be transcribed and this note may contain transcription errors which may not have been corrected  upon publication of note.**

## 2021-12-07 ENCOUNTER — Other Ambulatory Visit: Payer: Self-pay

## 2021-12-07 ENCOUNTER — Ambulatory Visit
Admission: RE | Admit: 2021-12-07 | Discharge: 2021-12-07 | Disposition: A | Discharge status: Home / Self Care | Payer: BC Managed Care – PPO | Source: Ambulatory Visit | Attending: Radiation Oncology | Admitting: Radiation Oncology

## 2021-12-07 DIAGNOSIS — C770 Secondary and unspecified malignant neoplasm of lymph nodes of head, face and neck: Secondary | ICD-10-CM | POA: Diagnosis not present

## 2021-12-07 DIAGNOSIS — C760 Malignant neoplasm of head, face and neck: Secondary | ICD-10-CM | POA: Diagnosis not present

## 2021-12-07 DIAGNOSIS — Z510 Encounter for antineoplastic radiation therapy: Secondary | ICD-10-CM | POA: Diagnosis not present

## 2021-12-07 LAB — RAD ONC ARIA SESSION SUMMARY
Course Elapsed Days: 14
Plan Fractions Treated to Date: 11
Plan Prescribed Dose Per Fraction: 2 Gy
Plan Total Fractions Prescribed: 30
Plan Total Prescribed Dose: 60 Gy
Reference Point Dosage Given to Date: 22 Gy
Reference Point Session Dosage Given: 2 Gy
Session Number: 11

## 2021-12-08 ENCOUNTER — Ambulatory Visit
Admission: RE | Admit: 2021-12-08 | Discharge: 2021-12-08 | Disposition: A | Discharge status: Home / Self Care | Payer: BC Managed Care – PPO | Source: Ambulatory Visit | Attending: Radiation Oncology | Admitting: Radiation Oncology

## 2021-12-08 ENCOUNTER — Other Ambulatory Visit: Payer: Self-pay

## 2021-12-08 DIAGNOSIS — C760 Malignant neoplasm of head, face and neck: Secondary | ICD-10-CM | POA: Diagnosis not present

## 2021-12-08 DIAGNOSIS — C770 Secondary and unspecified malignant neoplasm of lymph nodes of head, face and neck: Secondary | ICD-10-CM | POA: Diagnosis not present

## 2021-12-08 DIAGNOSIS — Z510 Encounter for antineoplastic radiation therapy: Secondary | ICD-10-CM | POA: Diagnosis not present

## 2021-12-08 LAB — RAD ONC ARIA SESSION SUMMARY
Course Elapsed Days: 15
Plan Fractions Treated to Date: 12
Plan Prescribed Dose Per Fraction: 2 Gy
Plan Total Fractions Prescribed: 30
Plan Total Prescribed Dose: 60 Gy
Reference Point Dosage Given to Date: 24 Gy
Reference Point Session Dosage Given: 2 Gy
Session Number: 12

## 2021-12-09 ENCOUNTER — Ambulatory Visit
Admission: RE | Admit: 2021-12-09 | Discharge: 2021-12-09 | Disposition: A | Discharge status: Home / Self Care | Payer: BC Managed Care – PPO | Source: Ambulatory Visit | Attending: Radiation Oncology | Admitting: Radiation Oncology

## 2021-12-09 ENCOUNTER — Other Ambulatory Visit: Payer: Self-pay

## 2021-12-09 ENCOUNTER — Other Ambulatory Visit: Payer: Self-pay | Admitting: Radiation Oncology

## 2021-12-09 DIAGNOSIS — Z510 Encounter for antineoplastic radiation therapy: Secondary | ICD-10-CM | POA: Diagnosis not present

## 2021-12-09 DIAGNOSIS — C770 Secondary and unspecified malignant neoplasm of lymph nodes of head, face and neck: Secondary | ICD-10-CM | POA: Diagnosis not present

## 2021-12-09 DIAGNOSIS — C760 Malignant neoplasm of head, face and neck: Secondary | ICD-10-CM | POA: Diagnosis not present

## 2021-12-09 LAB — RAD ONC ARIA SESSION SUMMARY
Course Elapsed Days: 16
Plan Fractions Treated to Date: 13
Plan Prescribed Dose Per Fraction: 2 Gy
Plan Total Fractions Prescribed: 30
Plan Total Prescribed Dose: 60 Gy
Reference Point Dosage Given to Date: 26 Gy
Reference Point Session Dosage Given: 2 Gy
Session Number: 13

## 2021-12-09 MED ORDER — LIDOCAINE VISCOUS HCL 2 % MT SOLN: OROMUCOSAL | 3 refills | Status: DC

## 2021-12-09 MED ORDER — SUCRALFATE 1 G PO TABS: ORAL_TABLET | ORAL | 3 refills | Status: DC

## 2021-12-13 ENCOUNTER — Ambulatory Visit
Admission: RE | Admit: 2021-12-13 | Discharge: 2021-12-13 | Disposition: A | Discharge status: Home / Self Care | Payer: BC Managed Care – PPO | Source: Ambulatory Visit | Attending: Radiation Oncology | Admitting: Radiation Oncology

## 2021-12-13 ENCOUNTER — Other Ambulatory Visit: Payer: Self-pay

## 2021-12-13 ENCOUNTER — Inpatient Hospital Stay: Payer: BC Managed Care – PPO | Admitting: Dietician

## 2021-12-13 ENCOUNTER — Ambulatory Visit: Payer: BC Managed Care – PPO | Attending: Radiation Oncology

## 2021-12-13 DIAGNOSIS — Z510 Encounter for antineoplastic radiation therapy: Secondary | ICD-10-CM | POA: Diagnosis not present

## 2021-12-13 DIAGNOSIS — C760 Malignant neoplasm of head, face and neck: Secondary | ICD-10-CM | POA: Diagnosis not present

## 2021-12-13 DIAGNOSIS — C770 Secondary and unspecified malignant neoplasm of lymph nodes of head, face and neck: Secondary | ICD-10-CM | POA: Diagnosis not present

## 2021-12-13 LAB — RAD ONC ARIA SESSION SUMMARY
Course Elapsed Days: 20
Plan Fractions Treated to Date: 14
Plan Prescribed Dose Per Fraction: 2 Gy
Plan Total Fractions Prescribed: 30
Plan Total Prescribed Dose: 60 Gy
Reference Point Dosage Given to Date: 28 Gy
Reference Point Session Dosage Given: 2 Gy
Session Number: 14

## 2021-12-13 NOTE — Progress Notes (Signed)
Nutrition Follow-up:  Patient with oropharyngeal cancer. S/p left neck dissection on 3/27. He is receiving daily radiation therapy to left next only.   Met with patient prior to radiation treatment. He reports sore throat and pain with swallowing. Patient reports metallic taste, but is forcing himself to eat. Yesterday he ate a bowl of cereal for breakfast and several bowls of meatball soup. Patient is drinking 2-3 Ensure Plus. He likes these. Patient reports having 3 left and requested additional case today. His mouth his dry. He is not doing baking soda salt water rinses. Patient is drinking 3 bottles of water. He continues to drink alcohol, 6-8 beers daily. Patient is taking multivitamins.   Medications: xylocaine, carafate  Labs: no new labs   Anthropometrics: last aria wt 150.6 lb on 5/22 stable   5/15 - 150.2 lb    Estimated Energy Needs  Kcals: 2390-2595 Protein: 109-123 Fluid: 2.5 L  NUTRITION DIAGNOSIS: Inadequate oral intake    INTERVENTION:  Encouraged soft smooth textures, suggested trying high calorie/high protein shake recipes Continue Ensure Plus High Protein, 2-3 daily One complimentary case Ensure Plus High Protein provided Reviewed strategies for dry mouth and altered taste, specifically metallic taste - recommend baking soda salt water rinses several times daily and before meals Continue MVI, B1, B complex - suggested using applesauce to aid with swallowing pills     MONITORING, EVALUATION, GOAL: weight trends, intake    NEXT VISIT: Tuesday June 6

## 2021-12-14 ENCOUNTER — Ambulatory Visit
Admission: RE | Admit: 2021-12-14 | Discharge: 2021-12-14 | Disposition: A | Discharge status: Home / Self Care | Payer: BC Managed Care – PPO | Source: Ambulatory Visit | Attending: Radiation Oncology | Admitting: Radiation Oncology

## 2021-12-14 ENCOUNTER — Other Ambulatory Visit: Payer: Self-pay

## 2021-12-14 DIAGNOSIS — C760 Malignant neoplasm of head, face and neck: Secondary | ICD-10-CM | POA: Diagnosis not present

## 2021-12-14 DIAGNOSIS — Z510 Encounter for antineoplastic radiation therapy: Secondary | ICD-10-CM | POA: Diagnosis not present

## 2021-12-14 DIAGNOSIS — C770 Secondary and unspecified malignant neoplasm of lymph nodes of head, face and neck: Secondary | ICD-10-CM | POA: Diagnosis not present

## 2021-12-14 LAB — RAD ONC ARIA SESSION SUMMARY
Course Elapsed Days: 21
Plan Fractions Treated to Date: 15
Plan Prescribed Dose Per Fraction: 2 Gy
Plan Total Fractions Prescribed: 30
Plan Total Prescribed Dose: 60 Gy
Reference Point Dosage Given to Date: 30 Gy
Reference Point Session Dosage Given: 2 Gy
Session Number: 15

## 2021-12-15 ENCOUNTER — Other Ambulatory Visit: Payer: Self-pay

## 2021-12-15 ENCOUNTER — Ambulatory Visit
Admission: RE | Admit: 2021-12-15 | Discharge: 2021-12-15 | Disposition: A | Discharge status: Home / Self Care | Payer: BC Managed Care – PPO | Source: Ambulatory Visit | Attending: Radiation Oncology | Admitting: Radiation Oncology

## 2021-12-15 DIAGNOSIS — C760 Malignant neoplasm of head, face and neck: Secondary | ICD-10-CM | POA: Diagnosis not present

## 2021-12-15 DIAGNOSIS — C770 Secondary and unspecified malignant neoplasm of lymph nodes of head, face and neck: Secondary | ICD-10-CM | POA: Diagnosis not present

## 2021-12-15 DIAGNOSIS — Z510 Encounter for antineoplastic radiation therapy: Secondary | ICD-10-CM | POA: Diagnosis not present

## 2021-12-15 LAB — RAD ONC ARIA SESSION SUMMARY
Course Elapsed Days: 22
Plan Fractions Treated to Date: 16
Plan Prescribed Dose Per Fraction: 2 Gy
Plan Total Fractions Prescribed: 30
Plan Total Prescribed Dose: 60 Gy
Reference Point Dosage Given to Date: 32 Gy
Reference Point Session Dosage Given: 2 Gy
Session Number: 16

## 2021-12-16 ENCOUNTER — Ambulatory Visit
Admission: RE | Admit: 2021-12-16 | Discharge: 2021-12-16 | Disposition: A | Discharge status: Home / Self Care | Payer: BC Managed Care – PPO | Source: Ambulatory Visit | Attending: Radiation Oncology | Admitting: Radiation Oncology

## 2021-12-16 ENCOUNTER — Other Ambulatory Visit: Payer: Self-pay

## 2021-12-16 DIAGNOSIS — C770 Secondary and unspecified malignant neoplasm of lymph nodes of head, face and neck: Secondary | ICD-10-CM | POA: Diagnosis not present

## 2021-12-16 DIAGNOSIS — C760 Malignant neoplasm of head, face and neck: Secondary | ICD-10-CM | POA: Diagnosis not present

## 2021-12-16 DIAGNOSIS — Z510 Encounter for antineoplastic radiation therapy: Secondary | ICD-10-CM | POA: Diagnosis not present

## 2021-12-16 LAB — RAD ONC ARIA SESSION SUMMARY
Course Elapsed Days: 23
Plan Fractions Treated to Date: 17
Plan Prescribed Dose Per Fraction: 2 Gy
Plan Total Fractions Prescribed: 30
Plan Total Prescribed Dose: 60 Gy
Reference Point Dosage Given to Date: 34 Gy
Reference Point Session Dosage Given: 2 Gy
Session Number: 17

## 2021-12-19 ENCOUNTER — Ambulatory Visit: Payer: BC Managed Care – PPO

## 2021-12-19 ENCOUNTER — Encounter: Payer: Self-pay | Admitting: Radiation Oncology

## 2021-12-19 NOTE — Progress Notes (Signed)
Today Mr. Delcarlo wife reached out to [facility] requesting I call Mr. Prusinski as he has expressed a strong desire to stop radiation.  I returned her call and had a lengthy with him and his wife on the line, but I then found out from his wife that he had walked away from the speaker phone mid discussion.  I had some patients waiting to see me so I stated to his wife that I would call back and her to encourage him to give me 5 minutes to talk with him; I just tried calling his home phone and mobile phone and left voicemails on both.  I asked Harlow Asa to reach out to them tomorrow.  His symptoms include generalized body pain and swelling in his wrists and fatigue.  I recommended that they talk to his PCP; he had a more mild version of this a year ago and was not sent to a rheumatologist at that time.  I also offered to refer him to a rheumatologist now but they don't seem keen on that idea.  He also is emotionally down according to his wife.  I do believe that his fatigue could in part be related to his radiation therapy but the radiation therapy does not explain his other symptoms.  I did explain during the phone conversation that the amount of radiation he has received is unlikely to improve his prognosis and made it very clear that it is my recommendation that he continue treatment as planned.  I am not sure how much of that he heard because I am not sure when he walked away from the phone.  I am sorry he is feeling so poorly and I hope we can help him get the medical attention he needs to figure out the etiology of his symptoms.  I also hope that he will consider continuing radiation so that he can benefit prognostically re: his neck cancer.  I asked them to call our clinic tomorrow. -----------------------------------  Eppie Gibson, MD

## 2021-12-20 ENCOUNTER — Inpatient Hospital Stay: Payer: BC Managed Care – PPO | Admitting: Dietician

## 2021-12-20 ENCOUNTER — Telehealth: Payer: Self-pay | Admitting: *Deleted

## 2021-12-20 ENCOUNTER — Ambulatory Visit: Payer: BC Managed Care – PPO

## 2021-12-20 ENCOUNTER — Encounter: Payer: Self-pay | Admitting: Radiation Oncology

## 2021-12-20 NOTE — Telephone Encounter (Signed)
CALLED PATIENT'S WIFE CHERYL TO ASK ABOUT MAKING A SIX WEEK FU APPT. FOR HER HUSBAND, PATIENT'S WIFE AGREED TO HAVE HER HUSBAND HERE ON 02-03-22 @ 10:20 AM

## 2021-12-21 ENCOUNTER — Ambulatory Visit: Payer: BC Managed Care – PPO

## 2021-12-21 NOTE — Progress Notes (Signed)
Oncology Nurse Navigator Documentation   Yesterday I spoke with Mr. Richard Terry wife, Richard Terry on the phone regarding his conversation with Dr. Isidore Moos the day before. Richard Terry reported that [name] was unwilling to speak to me or Dr. Isidore Moos further regarding his radiation treatment at this time. She let me know that he has decided to prematurely stop the recommended radiation treatments. He received 15/30 treatments and is aware after speaking with Dr. Isidore Moos that the amount of radiation that he received will unlikely improve his prognosis in the future. He has been scheduled a follow up with Dr. Isidore Moos in 6 weeks and a request has been sent to Dr. Janeice Robinson office for a follow up appointment in 3 months with successful fax transmission received. They both know to call me in the future for any questions or concerns.   Harlow Asa RN, BSN, OCN Head & Neck Oncology Nurse [place] at [facility] Phone # [phone]  Fax # [phone]

## 2021-12-22 ENCOUNTER — Encounter: Payer: BC Managed Care – PPO | Admitting: Nutrition

## 2021-12-22 ENCOUNTER — Ambulatory Visit: Payer: BC Managed Care – PPO

## 2021-12-23 ENCOUNTER — Ambulatory Visit: Payer: BC Managed Care – PPO

## 2021-12-26 ENCOUNTER — Ambulatory Visit: Payer: BC Managed Care – PPO

## 2021-12-27 ENCOUNTER — Ambulatory Visit: Payer: BC Managed Care – PPO

## 2021-12-28 ENCOUNTER — Ambulatory Visit: Payer: BC Managed Care – PPO

## 2021-12-29 ENCOUNTER — Encounter: Payer: BC Managed Care – PPO | Admitting: Nutrition

## 2021-12-29 ENCOUNTER — Ambulatory Visit: Payer: BC Managed Care – PPO

## 2021-12-30 ENCOUNTER — Ambulatory Visit: Payer: BC Managed Care – PPO

## 2021-12-30 ENCOUNTER — Inpatient Hospital Stay: Payer: BC Managed Care – PPO | Admitting: Dietician

## 2022-01-02 ENCOUNTER — Ambulatory Visit: Payer: BC Managed Care – PPO

## 2022-01-03 ENCOUNTER — Ambulatory Visit: Payer: BC Managed Care – PPO

## 2022-01-04 ENCOUNTER — Ambulatory Visit: Payer: BC Managed Care – PPO

## 2022-01-05 ENCOUNTER — Ambulatory Visit: Payer: BC Managed Care – PPO

## 2022-01-11 NOTE — Progress Notes (Signed)
End of Treatment Note  Diagnosis: C77.0 - Secondary and unspecified malignant neoplasm of lymph nodes of head, face and neck, Diagnosed 11/11/2021 (Active)     C76.0 - Malignant neoplasm of head, face and neck, Diagnosed 08/31/2021 (Active)  Cancer Staging:  Cancer Staging  Secondary and unspecified malignant neoplasm of lymph nodes of head, face and neck ([place]) Staging form: Cutaneous Carcinoma of the Head and Neck, AJCC 8th Edition - Clinical stage from 11/02/2021: Stage IV (cTX, cN3b, cM0) - Signed by Eppie Gibson, MD on 11/02/2021 Presence of extranodal extension: Present Extraosseous extension: Unknown  Indication for Treatment: curative  Radiation Treatment Dates: 11-23-21 to 12-16-21  Left neck was treated to 34 Gy in 17 fractions (patient stopped treatment against medical advice before completing planned course of 60 Gy in 30 fractions)   Technique: IMRT  Beam Energy:  6 MV  Narrative: The patient tolerated radiation therapy relatively well. However, he stopped treatment against medical advice due to symptoms including general malaise.  Plan: The patient has completed radiation treatment. The patient will return to radiation oncology clinic for routine follow-up in 6 wks. I advised them to call or return sooner if they have any questions or concerns related to their recovery or treatment. -----------------------------------  Eppie Gibson, MD

## 2022-01-23 ENCOUNTER — Ambulatory Visit: Payer: BC Managed Care – PPO | Admitting: Physical Therapy

## 2022-01-23 DIAGNOSIS — G5601 Carpal tunnel syndrome, right upper limb: Secondary | ICD-10-CM | POA: Diagnosis not present

## 2022-02-03 ENCOUNTER — Ambulatory Visit: Payer: Self-pay | Admitting: Radiation Oncology

## 2022-02-08 DIAGNOSIS — G5601 Carpal tunnel syndrome, right upper limb: Secondary | ICD-10-CM | POA: Diagnosis not present

## 2022-02-08 DIAGNOSIS — G5621 Lesion of ulnar nerve, right upper limb: Secondary | ICD-10-CM | POA: Diagnosis not present

## 2022-02-15 DIAGNOSIS — G5601 Carpal tunnel syndrome, right upper limb: Secondary | ICD-10-CM | POA: Diagnosis not present

## 2022-02-15 DIAGNOSIS — G5621 Lesion of ulnar nerve, right upper limb: Secondary | ICD-10-CM | POA: Diagnosis not present

## 2022-03-10 ENCOUNTER — Encounter (HOSPITAL_BASED_OUTPATIENT_CLINIC_OR_DEPARTMENT_OTHER): Payer: Self-pay | Admitting: Orthopedic Surgery

## 2022-03-13 ENCOUNTER — Other Ambulatory Visit: Payer: Self-pay

## 2022-03-13 ENCOUNTER — Encounter (HOSPITAL_BASED_OUTPATIENT_CLINIC_OR_DEPARTMENT_OTHER): Payer: Self-pay | Admitting: Orthopedic Surgery

## 2022-03-13 NOTE — Progress Notes (Signed)
Spoke w/ via phone for pre-op interview---pt wife cheryl Snuffer per pt request Lab needs dos---none-               Lab results------ COVID test -----patient states asymptomatic no test needed Arrive at -------530 am 03-28-2022 NPO after MN NO Solid Food.  Clear liquids from MN until---430 am Med rec completed Medications to take morning of surgery -----albuterol inhaler prn/bring inhaler, escitalopram, eye drop Diabetic medication -----n/a Patient instructed no nail polish to be worn day of surgery Patient instructed to bring photo id and insurance card day of surgery Patient aware to have Driver (ride ) / caregiver  wife cheryl Bas   for 24 hours after surgery  Patient Special Instructions -----none Pre-Op special Istructions -----none Patient  wife Malachy Mood verbalized understanding of instructions that were given at this phone interview. Patient  wife Malachy Mood denies  pt has shortness of breath, chest pain, fever, cough at this phone interview. Patient wife cheryl states patient can lie flat and does not use any home oxygen.  Chest ct 12-10-2020 epic Ct soft tissue neck 07-27-2021 epic

## 2022-03-13 NOTE — Progress Notes (Signed)
Reviewed patient medical history with dr s turk,mda, prior to calling patient,  pt ok for  [facility] surgery 03-28-2022 per dr s Gifford Shave, mda.

## 2022-03-17 DIAGNOSIS — C7989 Secondary malignant neoplasm of other specified sites: Secondary | ICD-10-CM | POA: Diagnosis not present

## 2022-03-17 DIAGNOSIS — C801 Malignant (primary) neoplasm, unspecified: Secondary | ICD-10-CM | POA: Diagnosis not present

## 2022-03-22 ENCOUNTER — Other Ambulatory Visit: Payer: Self-pay | Admitting: Otolaryngology

## 2022-03-22 DIAGNOSIS — C7989 Secondary malignant neoplasm of other specified sites: Secondary | ICD-10-CM

## 2022-03-27 NOTE — Anesthesia Preprocedure Evaluation (Signed)
Anesthesia Evaluation  Patient identified by MRN, date of birth, ID band Patient awake    Reviewed: Allergy & Precautions, NPO status , Patient's Chart, lab work & pertinent test results  History of Anesthesia Complications (+) history of anesthetic complications  Airway Mallampati: II  TM Distance: >3 FB Neck ROM: Full    Dental no notable dental hx. (+) Dental Advisory Given   Pulmonary COPD (no inhalers), Current Smoker and Patient abstained from smoking.,  Metastatic squamous neck cancer, unknown primary  CT neck: 1. No acute abnormality in the neck to account for the patient's symptoms. No abnormal soft tissue mass, fluid collection, or pathologic lymphadenopathy. 2. Hyperdensity in the incompletely imaged right globe may reflect history of silicone injection. Correlate with history.  CT chest: Mild diffuse bronchial wall thickening with very mild centrilobular and paraseptal emphysema; imaging findings suggestive of underlying COPD.    Pulmonary exam normal        Cardiovascular negative cardio ROS Normal cardiovascular exam     Neuro/Psych PSYCHIATRIC DISORDERS Anxiety    GI/Hepatic negative GI ROS, (+)     substance abuse (50 drinks/wk, 7 beers per day)  alcohol use,   Endo/Other  negative endocrine ROS  Renal/GU negative Renal ROS  negative genitourinary   Musculoskeletal  (+) Arthritis , Osteoarthritis,    Abdominal   Peds  Hematology negative hematology ROS (+) hct 43.4   Anesthesia Other Findings   Reproductive/Obstetrics negative OB ROS                            Anesthesia Physical  Anesthesia Plan  ASA: 3  Anesthesia Plan: MAC   Post-op Pain Management: Ofirmev IV (intra-op)* and Celebrex PO (pre-op)*   Induction: Intravenous  PONV Risk Score and Plan: 1 and Ondansetron, Dexamethasone, Midazolam and Treatment may vary due to age or medical  condition  Airway Management Planned: LMA  Additional Equipment: None  Intra-op Plan:   Post-operative Plan: Extubation in OR  Informed Consent: I have reviewed the patients History and Physical, chart, labs and discussed the procedure including the risks, benefits and alternatives for the proposed anesthesia with the patient or authorized representative who has indicated his/her understanding and acceptance.     Dental advisory given  Plan Discussed with: Anesthesiologist and CRNA  Anesthesia Plan Comments:        Anesthesia Quick Evaluation

## 2022-03-27 NOTE — H&P (Signed)
Preoperative History & Physical Exam  Surgeon: Matt Holmes, MD  Diagnosis: Right Carpal Tunnel Syndrome, Right cubital tunnel syndrome  Planned Procedure: Procedure(s) (LRB): Right Carpal Tunnel Release, Right cubital tunnel release in situ, possible transposition (Right) CUBITAL TUNNEL RELEASE (Right)  History of Present Illness:   Patient is a 61 y.o. male with symptoms consistent with Right Carpal Tunnel Syndrome, Right cubital tunnel syndrome who presents for surgical intervention. The risks, benefits and alternatives of surgical intervention were discussed and informed consent was obtained prior to surgery.  Past Medical History:  Past Medical History:  Diagnosis Date   Anxiety    Arthritis    Cancer ([place])    metastatic squamous cell neck cancer   Cough    smoker's cough last 5 years in morning when wakes up per wife on 03-13-2022   GERD (gastroesophageal reflux disease)    History of radiation therapy    11-23-2021 to 12-16-2021, completed 15 of 30 scheduled radiation treatments   Right carpal tunnel syndrome    Right eye injury    removed with prosthesis    Past Surgical History:  Past Surgical History:  Procedure Laterality Date   ANTERIOR CERVICAL DECOMP/DISCECTOMY FUSION N/A 10/22/2012   Procedure: ANTERIOR CERVICAL DECOMPRESSION/DISCECTOMY FUSION 3 LEVELS;  Surgeon: Otilio Connors, MD;  Location: [facility];  Service: Neurosurgery;  Laterality: N/A;  Cervical four-five,five-six,six-seven Anterior cervical decompression/diskectomy/fusion/allograft/trestle plate   ESOPHAGOSCOPY N/A 10/10/2021   Procedure: ESOPHAGOSCOPY;  Surgeon: Izora Gala, MD;  Location: [facility] [facility];  Service: ENT;  Laterality: N/A;   EYE SURGERY Right 01/2011   right eyelid surgery   LARYNGOSCOPY AND ESOPHAGOSCOPY N/A 10/10/2021   Procedure: DIRECT LARYNGOSCOPY WITH MULTIPLE BIOPSIES;  Surgeon: Izora Gala, MD;  Location: [facility];  Service: ENT;  Laterality: N/A;   NASOPHARYNGOSCOPY N/A 10/10/2021    Procedure: NASOPHARYNGOSCOPY;  Surgeon: Izora Gala, MD;  Location: [place];  Service: ENT;  Laterality: N/A;   RADICAL NECK DISSECTION Left 10/10/2021   Procedure: MODIFIED RADICAL NECK DISSECTION;  Surgeon: Izora Gala, MD;  Location: [place];  Service: ENT;  Laterality: Left;   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 Authorizing Provider  acetaminophen (TYLENOL) 500 MG tablet Take 1,000 mg by mouth every 6 (six) hours as needed for moderate pain.    [provider]  albuterol (VENTOLIN HFA) 108 (90 Base) MCG/ACT inhaler Inhale 1-2 puffs into the lungs every 6 (six) hours as needed for wheezing or shortness of breath. 01/16/20   [provider]  calcium elemental as carbonate (TUMS ULTRA 1000) 400 MG chewable tablet Chew 1,000-2,000 mg by mouth 2 (two) times daily as needed for heartburn.    [provider]  Carboxymethylcellul-Glycerin (LUBRICATING EYE DROPS OP) Place 1 drop into both eyes 2 (two) times daily as needed (dry eyes).    [provider]  diclofenac Sodium (VOLTAREN) 1 % GEL Apply 1 application. topically 4 (four) times daily as needed (pain).    [provider]  diphenhydramine-acetaminophen (TYLENOL PM) 25-500 MG TABS tablet Take 1 tablet by mouth at bedtime as needed (sleep).    [provider]  escitalopram (LEXAPRO) 20 MG tablet Take 20 mg by mouth daily. 08/30/21   [provider]  Multiple Vitamin (MULTIVITAMIN WITH MINERALS) TABS tablet Take 1  tablet by mouth daily.    [provider]  nicotine (NICODERM CQ - DOSED IN MG/24 HOURS) 14 mg/24hr patch Place 1 patch (14 mg total) onto the skin daily. Apply 21 mg patch daily x 6 wk, then '14mg'$  patch daily x 2 wk, then 7 mg patch daily x 2 wk Patient not taking: Reported on 03/13/2022  08/31/21   Eppie Gibson, MD  nicotine (NICODERM CQ - DOSED IN MG/24 HOURS) 21 mg/24hr patch Place 1 patch (21 mg total) onto the skin daily. Apply 21 mg patch daily x 6 wk, then '14mg'$  patch daily x 2 wk, then 7 mg patch daily x 2 wk Patient not taking: Reported on 03/13/2022 08/31/21   Eppie Gibson, MD  nicotine (NICODERM CQ - DOSED IN MG/24 HR) 7 mg/24hr patch Place 1 patch (7 mg total) onto the skin daily. Apply 21 mg patch daily x 6 wk, then '14mg'$  patch daily x 2 wk, then 7 mg patch daily x 2 wk Patient not taking: Reported on 03/13/2022 08/31/21   Eppie Gibson, MD    Allergies:  Bupropion  Review of Systems: Negative except per HPI.  Physical Exam: Alert and oriented, NAD Head and neck: no masses, normal alignment CV: pulse intact Pulm: no increased work of breathing, respirations even and unlabored Abdomen: non-distended Extremities: extremities warm and well perfused  LABS: No results found for this or any previous visit (from the past 2160 hour(s)).   Complete History and Physical exam available in the office notes  Richard Terry

## 2022-03-28 ENCOUNTER — Encounter (HOSPITAL_BASED_OUTPATIENT_CLINIC_OR_DEPARTMENT_OTHER)
Admission: RE | Disposition: A | Discharge status: Home / Self Care | Payer: Self-pay | Source: Home / Self Care | Attending: Orthopedic Surgery

## 2022-03-28 ENCOUNTER — Ambulatory Visit (HOSPITAL_BASED_OUTPATIENT_CLINIC_OR_DEPARTMENT_OTHER): Payer: BC Managed Care – PPO | Admitting: Anesthesiology

## 2022-03-28 ENCOUNTER — Other Ambulatory Visit: Payer: Self-pay

## 2022-03-28 ENCOUNTER — Ambulatory Visit (HOSPITAL_BASED_OUTPATIENT_CLINIC_OR_DEPARTMENT_OTHER)
Admission: RE | Admit: 2022-03-28 | Discharge: 2022-03-28 | Disposition: A | Discharge status: Home / Self Care | Payer: BC Managed Care – PPO | Attending: Orthopedic Surgery | Admitting: Orthopedic Surgery

## 2022-03-28 ENCOUNTER — Encounter (HOSPITAL_BASED_OUTPATIENT_CLINIC_OR_DEPARTMENT_OTHER): Payer: Self-pay | Admitting: Orthopedic Surgery

## 2022-03-28 DIAGNOSIS — Z85828 Personal history of other malignant neoplasm of skin: Secondary | ICD-10-CM | POA: Insufficient documentation

## 2022-03-28 DIAGNOSIS — G5621 Lesion of ulnar nerve, right upper limb: Secondary | ICD-10-CM | POA: Diagnosis not present

## 2022-03-28 DIAGNOSIS — J449 Chronic obstructive pulmonary disease, unspecified: Secondary | ICD-10-CM | POA: Insufficient documentation

## 2022-03-28 DIAGNOSIS — F419 Anxiety disorder, unspecified: Secondary | ICD-10-CM | POA: Insufficient documentation

## 2022-03-28 DIAGNOSIS — F17200 Nicotine dependence, unspecified, uncomplicated: Secondary | ICD-10-CM | POA: Insufficient documentation

## 2022-03-28 DIAGNOSIS — Z01818 Encounter for other preprocedural examination: Secondary | ICD-10-CM

## 2022-03-28 DIAGNOSIS — G5601 Carpal tunnel syndrome, right upper limb: Secondary | ICD-10-CM | POA: Diagnosis not present

## 2022-03-28 DIAGNOSIS — F17210 Nicotine dependence, cigarettes, uncomplicated: Secondary | ICD-10-CM | POA: Diagnosis not present

## 2022-03-28 DIAGNOSIS — M1990 Unspecified osteoarthritis, unspecified site: Secondary | ICD-10-CM | POA: Insufficient documentation

## 2022-03-28 HISTORY — DX: Gastro-esophageal reflux disease without esophagitis: K21.9

## 2022-03-28 HISTORY — DX: Carpal tunnel syndrome, right upper limb: G56.01

## 2022-03-28 HISTORY — PX: CARPAL TUNNEL WITH CUBITAL TUNNEL: SHX5608

## 2022-03-28 HISTORY — PX: ULNAR TUNNEL RELEASE: SHX820

## 2022-03-28 HISTORY — DX: Personal history of irradiation: Z92.3

## 2022-03-28 SURGERY — RELEASE, CARPAL TUNNEL AND CUBITAL TUNNEL
Anesthesia: General | Site: Hand | Laterality: Right

## 2022-03-28 MED ORDER — ACETAMINOPHEN 500 MG PO TABS
1000.0000 mg | ORAL_TABLET | Freq: Once | ORAL | Status: AC
  Administered 2022-03-28: 1000 mg via ORAL

## 2022-03-28 MED ORDER — CEFAZOLIN SODIUM-DEXTROSE 2-4 GM/100ML-% IV SOLN
2.0000 g | INTRAVENOUS | Status: AC
  Administered 2022-03-28: 2 g via INTRAVENOUS

## 2022-03-28 MED ORDER — PROPOFOL 10 MG/ML IV BOLUS
INTRAVENOUS | Status: DC | PRN
  Administered 2022-03-28: 150 mg via INTRAVENOUS

## 2022-03-28 MED ORDER — 0.9 % SODIUM CHLORIDE (POUR BTL) OPTIME
TOPICAL | Status: DC | PRN
  Administered 2022-03-28: 1000 mL

## 2022-03-28 MED ORDER — MIDAZOLAM HCL 2 MG/2ML IJ SOLN
INTRAMUSCULAR | Status: AC
  Filled 2022-03-28: qty 2

## 2022-03-28 MED ORDER — BACITRACIN ZINC 500 UNIT/GM EX OINT
TOPICAL_OINTMENT | CUTANEOUS | Status: AC
  Filled 2022-03-28: qty 28.35

## 2022-03-28 MED ORDER — MIDAZOLAM HCL 2 MG/2ML IJ SOLN
INTRAMUSCULAR | Status: DC | PRN
  Administered 2022-03-28: 2 mg via INTRAVENOUS

## 2022-03-28 MED ORDER — ONDANSETRON HCL 4 MG/2ML IJ SOLN
INTRAMUSCULAR | Status: DC | PRN
  Administered 2022-03-28: 4 mg via INTRAVENOUS

## 2022-03-28 MED ORDER — BACITRACIN ZINC 500 UNIT/GM EX OINT
TOPICAL_OINTMENT | CUTANEOUS | Status: DC | PRN
  Administered 2022-03-28: 1 via TOPICAL

## 2022-03-28 MED ORDER — LIDOCAINE HCL 1 % IJ SOLN
INTRAMUSCULAR | Status: AC
  Filled 2022-03-28: qty 20

## 2022-03-28 MED ORDER — HYDROCODONE-ACETAMINOPHEN 5-325 MG PO TABS: 1.0000 | ORAL_TABLET | Freq: Four times a day (QID) | ORAL | 0 refills | Status: AC | PRN

## 2022-03-28 MED ORDER — KETAMINE HCL 50 MG/5ML IJ SOSY
PREFILLED_SYRINGE | INTRAMUSCULAR | Status: AC
  Filled 2022-03-28: qty 5

## 2022-03-28 MED ORDER — PROMETHAZINE HCL 25 MG/ML IJ SOLN: 6.2500 mg | INTRAMUSCULAR | Status: DC | PRN

## 2022-03-28 MED ORDER — CELECOXIB 200 MG PO CAPS
200.0000 mg | ORAL_CAPSULE | Freq: Once | ORAL | Status: AC
  Administered 2022-03-28: 200 mg via ORAL

## 2022-03-28 MED ORDER — AMISULPRIDE (ANTIEMETIC) 5 MG/2ML IV SOLN
10.0000 mg | Freq: Once | INTRAVENOUS | Status: DC | PRN
Start: 2022-03-28 — End: 2022-03-28

## 2022-03-28 MED ORDER — LACTATED RINGERS IV SOLN: INTRAVENOUS | Status: DC

## 2022-03-28 MED ORDER — LIDOCAINE HCL (PF) 2 % IJ SOLN
INTRAMUSCULAR | Status: AC
  Filled 2022-03-28: qty 5

## 2022-03-28 MED ORDER — CEFAZOLIN SODIUM-DEXTROSE 2-4 GM/100ML-% IV SOLN
INTRAVENOUS | Status: AC
  Filled 2022-03-28: qty 100

## 2022-03-28 MED ORDER — DEXAMETHASONE SODIUM PHOSPHATE 10 MG/ML IJ SOLN
INTRAMUSCULAR | Status: DC | PRN
  Administered 2022-03-28 (×2): 5 mg via INTRAVENOUS

## 2022-03-28 MED ORDER — PROPOFOL 10 MG/ML IV BOLUS
INTRAVENOUS | Status: AC
  Filled 2022-03-28: qty 20

## 2022-03-28 MED ORDER — PROPOFOL 500 MG/50ML IV EMUL
INTRAVENOUS | Status: AC
  Filled 2022-03-28: qty 50

## 2022-03-28 MED ORDER — ACETAMINOPHEN 500 MG PO TABS
ORAL_TABLET | ORAL | Status: AC
  Filled 2022-03-28: qty 2

## 2022-03-28 MED ORDER — BUPIVACAINE HCL (PF) 0.5 % IJ SOLN
INTRAMUSCULAR | Status: AC
  Filled 2022-03-28: qty 30

## 2022-03-28 MED ORDER — FENTANYL CITRATE (PF) 100 MCG/2ML IJ SOLN
INTRAMUSCULAR | Status: DC | PRN
  Administered 2022-03-28: 100 ug via INTRAVENOUS

## 2022-03-28 MED ORDER — ONDANSETRON HCL 4 MG/2ML IJ SOLN
INTRAMUSCULAR | Status: AC
  Filled 2022-03-28: qty 2

## 2022-03-28 MED ORDER — FENTANYL CITRATE (PF) 100 MCG/2ML IJ SOLN
INTRAMUSCULAR | Status: AC
  Filled 2022-03-28: qty 2

## 2022-03-28 MED ORDER — CELECOXIB 200 MG PO CAPS
ORAL_CAPSULE | ORAL | Status: AC
  Filled 2022-03-28: qty 1

## 2022-03-28 MED ORDER — LIDOCAINE 2% (20 MG/ML) 5 ML SYRINGE
INTRAMUSCULAR | Status: DC | PRN
  Administered 2022-03-28: 60 mg via INTRAVENOUS

## 2022-03-28 MED ORDER — DEXAMETHASONE SODIUM PHOSPHATE 10 MG/ML IJ SOLN
INTRAMUSCULAR | Status: AC
  Filled 2022-03-28: qty 1

## 2022-03-28 MED ORDER — FENTANYL CITRATE (PF) 100 MCG/2ML IJ SOLN: 25.0000 ug | INTRAMUSCULAR | Status: DC | PRN

## 2022-03-28 SURGICAL SUPPLY — 33 items
BLADE SURG 15 STRL LF DISP TIS (BLADE) ×2 IMPLANT
BLADE SURG 15 STRL SS (BLADE) ×2
BNDG CMPR 9X4 STRL LF SNTH (GAUZE/BANDAGES/DRESSINGS) ×2
BNDG ELASTIC 4X5.8 VLCR STR LF (GAUZE/BANDAGES/DRESSINGS) ×2 IMPLANT
BNDG ESMARK 4X9 LF (GAUZE/BANDAGES/DRESSINGS) ×2 IMPLANT
CORD BIPOLAR FORCEPS 12FT (ELECTRODE) ×2 IMPLANT
COVER BACK TABLE 60X90IN (DRAPES) ×2 IMPLANT
CUFF TOURN SGL QUICK 18X4 (TOURNIQUET CUFF) ×2 IMPLANT
DRAPE EXTREMITY T 121X128X90 (DISPOSABLE) ×2 IMPLANT
DRAPE SHEET LG 3/4 BI-LAMINATE (DRAPES) ×2 IMPLANT
DRAPE SURG 17X23 STRL (DRAPES) ×2 IMPLANT
DRSG EMULSION OIL 3X3 NADH (GAUZE/BANDAGES/DRESSINGS) ×2 IMPLANT
GAUZE 4X4 16PLY ~~LOC~~+RFID DBL (SPONGE) ×2 IMPLANT
GAUZE SPONGE 4X4 12PLY STRL (GAUZE/BANDAGES/DRESSINGS) ×2 IMPLANT
GLOVE BIOGEL PI IND STRL 7.5 (GLOVE) ×4 IMPLANT
GOWN STRL REUS W/TWL LRG LVL3 (GOWN DISPOSABLE) ×2 IMPLANT
HIBICLENS CHG 4% 4OZ BTL (MISCELLANEOUS) ×2 IMPLANT
KIT TURNOVER CYSTO (KITS) ×2 IMPLANT
KNIFE CARPAL TUNNEL (BLADE) ×2 IMPLANT
NEEDLE HYPO 22GX1.5 SAFETY (NEEDLE) ×2 IMPLANT
NS IRRIG 1000ML POUR BTL (IV SOLUTION) ×2 IMPLANT
NS IRRIG 500ML POUR BTL (IV SOLUTION) ×2 IMPLANT
PACK BASIN DAY SURGERY FS (CUSTOM PROCEDURE TRAY) ×2 IMPLANT
PAD CAST 4YDX4 CTTN HI CHSV (CAST SUPPLIES) ×2 IMPLANT
PADDING CAST COTTON 4X4 STRL (CAST SUPPLIES) ×2
SUT CHROMIC 4 0 PS 2 18 (SUTURE) IMPLANT
SUT ETHILON 4 0 PS 2 18 (SUTURE) ×2 IMPLANT
SUT ETHILON 8 0 BV130 4 (SUTURE) ×2 IMPLANT
SYR 10ML LL (SYRINGE) ×2 IMPLANT
SYR BULB EAR ULCER 3OZ GRN STR (SYRINGE) ×2 IMPLANT
TOWEL OR 17X26 10 PK STRL BLUE (TOWEL DISPOSABLE) ×2 IMPLANT
TRAY DSU PREP LF (CUSTOM PROCEDURE TRAY) ×2 IMPLANT
UNDERPAD 30X36 HEAVY ABSORB (UNDERPADS AND DIAPERS) ×2 IMPLANT

## 2022-03-28 NOTE — Interval H&P Note (Signed)
History and Physical Interval Note:  03/28/2022 7:27 AM  Richard Terry.  has presented today for surgery, with the diagnosis of Right Carpal Tunnel Syndrome, Right cubital tunnel syndrome.  The various methods of treatment have been discussed with the patient and family. After consideration of risks, benefits and other options for treatment, the patient has consented to  Procedure(s) with comments: Right Carpal Tunnel Release, Right cubital tunnel release in situ, possible transposition (Right) as a surgical intervention.  The patient's history has been reviewed, patient examined, no change in status, stable for surgery.  I have reviewed the patient's chart and labs.  Questions were answered to the patient's satisfaction.     [name]

## 2022-03-28 NOTE — Anesthesia Postprocedure Evaluation (Signed)
Anesthesia Post Note  Patient: Richard Terry.  Procedure(s) Performed: Right Carpal Tunnel Release, Right cubital tunnel release in situ (Right: Hand) CUBITAL TUNNEL RELEASE (Right: Arm Lower)     Patient location during evaluation: PACU Anesthesia Type: General Level of consciousness: sedated Pain management: pain level controlled Vital Signs Assessment: post-procedure vital signs reviewed and stable Respiratory status: spontaneous breathing and respiratory function stable Cardiovascular status: stable Postop Assessment: no apparent nausea or vomiting Anesthetic complications: no   No notable events documented.  Last Vitals:  Vitals:   03/28/22 0900 03/28/22 0920  BP: (!) 145/84 126/81  Pulse: 60 66  Resp: 12 20  Temp: 36.6 C   SpO2: 94% 96%    Last Pain:  Vitals:   03/28/22 0920  TempSrc:   PainSc: 0-No pain                 Libbey Duce DANIEL

## 2022-03-28 NOTE — Discharge Instructions (Addendum)
  Orthopaedic Hand Surgery Discharge Instructions  WEIGHT BEARING STATUS: Non weight bearing on operative extremity  DRESSING CARE: Please keep your dressing/splint/cast clean and dry until your follow-up appointment. You may shower by placing a waterproof covering over your dressing/splint/cast. Contact your surgeon if your splint/cast gets wet. It will need to be changed to prevent skin breakdown.  PAIN CONTROL: First line medications for post operative pain control are Tylenol (acetaminophen) and Motrin (ibuprofen) if you are able to take these medications. If you have been prescribed a medication these can be taken as breakthrough pain medications. Please note that some narcotic pain medication has acetaminophen added and you should never consume more than 4,000mg of acetaminophen in 24-hour period. Please note that if you are given Toradol (ketorolac) you should not take similar medications such as ibuprofen or naproxen.  DISCHARGE MEDICATIONS: If you have been prescribed medication it was sent electronically to your pharmacy. No changes have been made to your home medications.  ICE/ELEVATION: Ice and elevate your injured extremity as needed. Avoid direct contact of ice with skin.   BANDAGE FEELS TOO TIGHT: If your bandage feels too tight, first make sure you are elevating your fingers as much as possible. The outer layer of the bandage can be unwrapped and reapplied more loosely. If no improvement, you may carefully cut the inner layer longitudinally until the pressure has resolved and then rewrap the outer layer. If you are not comfortable with these instructions, please call the office and the bandage can be changed for you.   FOLLOW UP: You will be called after surgery with an appointment date and time, however if you have not received a phone call within 3 days, please call during regular office hours at [phone] to schedule a post operative appointment.  Please Seek Medical Attention  if: Call MD for: pain or pressure in chest, jaw, arm, back, neck  Call MD for: temperature greater than 101 F for more than 24 hrs Call MD for: difficulty breathing Call MD for: incision redness, bleeding, drainage  Call MD for: palpitations or feeling that the heart is racing  Call MD for: increased swelling in arm, leg, ankle, or abdomen  Call MD for: lightheadedness, dizziness, fainting Call 911 or go to ER for any medical emergency if you are not able to get in touch with your doctor   J. Reid Spears, MD Orthopaedic Hand Surgeon [facility] Office number: [phone] [address] [place], [place] [address]  Post Anesthesia Home Care Instructions  Activity: Get plenty of rest for the remainder of the day. A responsible individual must stay with you for 24 hours following the procedure.  For the next 24 hours, DO NOT: -Drive a car -Operate machinery -Drink alcoholic beverages -Take any medication unless instructed by your physician -Make any legal decisions or sign important papers.  Meals: Start with liquid foods such as gelatin or soup. Progress to regular foods as tolerated. Avoid greasy, spicy, heavy foods. If nausea and/or vomiting occur, drink only clear liquids until the nausea and/or vomiting subsides. Call your physician if vomiting continues.  Special Instructions/Symptoms: Your throat may feel dry or sore from the anesthesia or the breathing tube placed in your throat during surgery. If this causes discomfort, gargle with warm salt water. The discomfort should disappear within 24 hours.      

## 2022-03-28 NOTE — Op Note (Signed)
OPERATIVE NOTE  DATE OF PROCEDURE: 03/28/2022  SURGEONS:  Primary: J. Reid Spears, MD  PREOPERATIVE DIAGNOSIS: Right cubital tunnel syndrome, Right carpal tunnel syndrome  POSTOPERATIVE DIAGNOSIS: Same  NAME OF PROCEDURE:   Right cubital tunnel release, in situ decompression of ulnar nerve Right carpal tunnel release  ANESTHESIA: Local + LMA  SKIN PREPARATION: Hibiclens  ESTIMATED BLOOD LOSS: Minimal  IMPLANTS: none  INDICATIONS: Richard F Leugers Jr. is a 61 y.o. who has the diagnosis of right cubital and carpal tunnel syndrome. The patient has decided to proceed with surgical intervention.  Risks, benefits and alternatives of operative management were discussed including, but not limited to, risks of anesthesia complications, infection, pain, persistent symptoms, stiffness, need for future surgery.  The patient understands, agrees and elects to proceed with surgery.    DESCRIPTION OF PROCEDURE: The patient was met in the pre-operative area and their identity was verified.  The operative location and laterality was also verified and marked.  The patient was brought to the OR and was placed supine on the table.  After repeat patient identification with the operative team anesthesia was provided and the patient was prepped and draped in the usual sterile fashion.  A final timeout was performed verifying the correction patient, procedure, location and laterality.  The right upper extremity was elevated and exsanguinated with an esmarch and tourniquet inflated to 250mmHg. An incision was made over the medial elbow at the cubital tunnel. Skin and subcutaneous tissues were divided and the medial antebrachial cutaneous nerves were protected. The roof of the cubital tunnel was incised between the medial epicondyle and the olecranon. The ulnar nerve was decompressed distally and proximally taking care to release the superficial and deep fascia of the FCU as well as the compressive fibers  proximally. There was complete release of the ulnar nerve. The elbow was taken through a range of motion and there was no subluxation of the nerve. The wound was thoroughly irrigated with normal saline and closed with 4-0 nylon suture in horizontal mattress fashion.  A standard 1.5 cm incision was made in the midpalm.  This was carried down through the subcutaneous tissues and palmar fascia to the transverse carpal ligament.  The distal one-half of the transverse carpal ligament was incised longitudinally under direct vision using a 15 blade.  The carpal tunnel release guide was then placed under direct vision on the transverse carpal ligament and slid proximally.  The guide was palpated into appropriate alignment longitudinally.  Contact with the transverse carpal ligament was maintained throughout passing.  The blade was engaged into the guide and the remaining portion of the transverse carpal ligament released completely.  No other abnormalities were noted.  The wound was copiously irrigated and the skin closed using horizontal mattress 4-0 nylon sutures.  A light bulky dressing was placed over the hand and elbow. At the end of the case all counts were correct x2. The patient tolerated the procedure well, was awoken from anesthesia and brought to PACU in stable condition.   J. Reid Spears, MD  

## 2022-03-28 NOTE — Anesthesia Procedure Notes (Signed)
Procedure Name: LMA Insertion Date/Time: 03/28/2022 7:38 AM  Performed by: Suan Halter, CRNAPre-anesthesia Checklist: Patient identified, Emergency Drugs available, Suction available and Patient being monitored Patient Re-evaluated:Patient Re-evaluated prior to induction Oxygen Delivery Method: Circle system utilized Preoxygenation: Pre-oxygenation with 100% oxygen Induction Type: IV induction Ventilation: Mask ventilation without difficulty LMA: LMA inserted LMA Size: 4.0 Number of attempts: 1 Airway Equipment and Method: Bite block Placement Confirmation: positive ETCO2 Tube secured with: Tape Dental Injury: Teeth and Oropharynx as per pre-operative assessment

## 2022-03-28 NOTE — Transfer of Care (Signed)
Immediate Anesthesia Transfer of Care Note  Patient: Richard Terry.  Procedure(s) Performed: Procedure(s) (LRB): Right Carpal Tunnel Release, Right cubital tunnel release in situ (Right) CUBITAL TUNNEL RELEASE (Right)  Patient Location: PACU  Anesthesia Type: General  Level of Consciousness: awake, oriented, sedated and patient cooperative  Airway & Oxygen Therapy: Patient Spontanous Breathing and Patient connected to face mask oxygen  Post-op Assessment: Report given to PACU RN and Post -op Vital signs reviewed and stable  Post vital signs: Reviewed and stable  Complications: No apparent anesthesia complications  Last Vitals:  Vitals Value Taken Time  BP 126/81 03/28/22 0920  Temp 36.6 C 03/28/22 0900  Pulse 66 03/28/22 0920  Resp 20 03/28/22 0920  SpO2 96 % 03/28/22 0920    Last Pain:  Vitals:   03/28/22 0920  TempSrc:   PainSc: 0-No pain      Patients Stated Pain Goal: 4 (69/67/89 3810)  Complications: No notable events documented.

## 2022-03-29 ENCOUNTER — Encounter (HOSPITAL_BASED_OUTPATIENT_CLINIC_OR_DEPARTMENT_OTHER): Payer: Self-pay | Admitting: Orthopedic Surgery

## 2022-04-04 DIAGNOSIS — M25521 Pain in right elbow: Secondary | ICD-10-CM | POA: Diagnosis not present

## 2022-04-13 ENCOUNTER — Encounter (HOSPITAL_COMMUNITY): Payer: Self-pay

## 2022-04-13 ENCOUNTER — Ambulatory Visit (HOSPITAL_COMMUNITY)
Admission: RE | Admit: 2022-04-13 | Discharge: 2022-04-13 | Disposition: A | Discharge status: Home / Self Care | Payer: BC Managed Care – PPO | Source: Ambulatory Visit | Attending: Otolaryngology | Admitting: Otolaryngology

## 2022-04-13 DIAGNOSIS — R918 Other nonspecific abnormal finding of lung field: Secondary | ICD-10-CM | POA: Diagnosis not present

## 2022-04-13 DIAGNOSIS — C801 Malignant (primary) neoplasm, unspecified: Secondary | ICD-10-CM | POA: Diagnosis present

## 2022-04-13 DIAGNOSIS — J9811 Atelectasis: Secondary | ICD-10-CM | POA: Diagnosis not present

## 2022-04-13 DIAGNOSIS — C7989 Secondary malignant neoplasm of other specified sites: Secondary | ICD-10-CM | POA: Insufficient documentation

## 2022-04-13 DIAGNOSIS — C4442 Squamous cell carcinoma of skin of scalp and neck: Secondary | ICD-10-CM | POA: Diagnosis not present

## 2022-04-13 MED ORDER — IOHEXOL 300 MG/ML  SOLN
100.0000 mL | Freq: Once | INTRAMUSCULAR | Status: AC | PRN
  Administered 2022-04-13: 100 mL via INTRAVENOUS

## 2022-04-26 ENCOUNTER — Ambulatory Visit (INDEPENDENT_AMBULATORY_CARE_PROVIDER_SITE_OTHER): Payer: BC Managed Care – PPO | Admitting: Podiatry

## 2022-04-26 ENCOUNTER — Ambulatory Visit (INDEPENDENT_AMBULATORY_CARE_PROVIDER_SITE_OTHER): Payer: BC Managed Care – PPO

## 2022-04-26 DIAGNOSIS — M7741 Metatarsalgia, right foot: Secondary | ICD-10-CM | POA: Diagnosis not present

## 2022-04-26 DIAGNOSIS — M7742 Metatarsalgia, left foot: Secondary | ICD-10-CM

## 2022-04-26 DIAGNOSIS — Q667 Congenital pes cavus, unspecified foot: Secondary | ICD-10-CM | POA: Diagnosis not present

## 2022-04-26 DIAGNOSIS — Q6671 Congenital pes cavus, right foot: Secondary | ICD-10-CM

## 2022-04-26 DIAGNOSIS — Q6672 Congenital pes cavus, left foot: Secondary | ICD-10-CM | POA: Diagnosis not present

## 2022-04-26 DIAGNOSIS — M79671 Pain in right foot: Secondary | ICD-10-CM

## 2022-04-26 MED ORDER — MELOXICAM 15 MG PO TABS: 15.0000 mg | ORAL_TABLET | Freq: Every day | ORAL | 0 refills | Status: AC

## 2022-04-26 MED ORDER — METHYLPREDNISOLONE 4 MG PO TBPK: ORAL_TABLET | ORAL | 0 refills | Status: AC

## 2022-04-26 NOTE — Progress Notes (Signed)
Subjective:  Patient ID: Richard Terry., male    DOB: 1961-06-13,  MRN: [id number]  Chief Complaint  Patient presents with   Foot Pain    Bilateral foot pain on the top of the foot and bottom of the foot     61 y.o. male presents with the above complaint.  Patient presents with bilateral forefoot metatarsalgia with underlying high arch foot structure.  Patient states been present for quite some time is progressive gotten worse hurts with ambulation hurts with pressure on top and the bottom of the foot.  He wears nonsupportive shoes.  He has not seen anyone else prior to seeing me pain scale is 8 out of 10 dull aching nature.  Hurts with ambulation hurts with pressure   Review of Systems: Negative except as noted in the HPI. Denies N/V/F/Ch.  Past Medical History:  Diagnosis Date   Anxiety    Arthritis    Cancer ([place])    metastatic squamous cell neck cancer   Cough    smoker's cough last 5 years in morning when wakes up per wife on 03-13-2022   GERD (gastroesophageal reflux disease)    History of radiation therapy    11-23-2021 to 12-16-2021, completed 15 of 30 scheduled radiation treatments   Right carpal tunnel syndrome    Right eye injury    removed with prosthesis    Current Outpatient Medications:    meloxicam (MOBIC) 15 MG tablet, Take 1 tablet (15 mg total) by mouth daily., Disp: 30 tablet, Rfl: 0   methylPREDNISolone (MEDROL DOSEPAK) 4 MG TBPK tablet, Take as directed, Disp: 21 each, Rfl: 0   albuterol (VENTOLIN HFA) 108 (90 Base) MCG/ACT inhaler, Inhale 1-2 puffs into the lungs every 6 (six) hours as needed for wheezing or shortness of breath., Disp: , Rfl:    calcium elemental as carbonate (TUMS ULTRA 1000) 400 MG chewable tablet, Chew 1,000-2,000 mg by mouth 2 (two) times daily as needed for heartburn., Disp: , Rfl:    Carboxymethylcellul-Glycerin (LUBRICATING EYE DROPS OP), Place 1 drop into both eyes 2 (two) times daily as needed (dry eyes)., Disp: , Rfl:     diclofenac Sodium (VOLTAREN) 1 % GEL, Apply 1 application. topically 4 (four) times daily as needed (pain)., Disp: , Rfl:    escitalopram (LEXAPRO) 20 MG tablet, Take 20 mg by mouth daily., Disp: , Rfl:    Multiple Vitamin (MULTIVITAMIN WITH MINERALS) TABS tablet, Take 1 tablet by mouth daily., Disp: , Rfl:   Social History   Tobacco Use  Smoking Status Every Day   Packs/day: 1.00   Years: 13.00   Total pack years: 13.00   Types: Cigarettes  Smokeless Tobacco Never    Allergies  Allergen Reactions   Bupropion Other (See Comments)    Caused bloodshot eyes   Objective:  There were no vitals filed for this visit. There is no height or weight on file to calculate BMI. Constitutional Well developed. Well nourished.  Vascular Dorsalis pedis pulses palpable bilaterally. Posterior tibial pulses palpable bilaterally. Capillary refill normal to all digits.  No cyanosis or clubbing noted. Pedal hair growth normal.  Neurologic Normal speech. Oriented to person, place, and time. Epicritic sensation to light touch grossly present bilaterally.  Dermatologic Nails well groomed and normal in appearance. No open wounds. No skin lesions.  Orthopedic: Gait examination shows pes cavus foot structure with forefoot pressure.  Forefoot generalized metatarsalgia noted bilaterally.  No pain on palpation to the individual metatarsophalangeal joint no extensor or  flexor tendinitis noted.  No pain at the sesamoidal complex   Radiographs: 3 views of skeletally mature adult bilateral foot: High arch foot structure noted subtalar joint arthritis noted.  No other bony abnormalities identified.  No bunion deformity noted. Assessment:   1. Pes cavus   2. Metatarsalgia of both feet    Plan:  Patient was evaluated and treated and all questions answered.  Bilateral forefoot metatarsalgia with underlying pes cavus foot type -All questions and concerns were discussed with the patient in extensive detail  given the amount of pain that he is experiencing he will benefit from custom orthotics with metatarsal gel pad to take the pressure off of the forefoot.  I also believe he will benefit from metatarsal pads temporarily which were dispensed. -If the no improvement we will discuss steroid injection versus cam boot immobilization  No follow-ups on file.

## 2022-04-27 ENCOUNTER — Telehealth: Payer: Self-pay | Admitting: Podiatry

## 2022-04-27 NOTE — Telephone Encounter (Signed)
Patients wife called and she is wanting to now if her insurance requires a prior authorization for the orthotics. Also she wants to know what his diagnosis is? This was discussed ant the visit but she cant remember. The note looks to be incomplete at this time.  Please advise.

## 2022-04-27 NOTE — Telephone Encounter (Signed)
Please advise 

## 2022-05-01 ENCOUNTER — Telehealth: Payer: Self-pay

## 2022-05-01 NOTE — Telephone Encounter (Signed)
error 

## 2022-05-15 ENCOUNTER — Ambulatory Visit: Payer: BC Managed Care – PPO | Admitting: *Deleted

## 2022-05-15 DIAGNOSIS — Q667 Congenital pes cavus, unspecified foot: Secondary | ICD-10-CM

## 2022-05-15 NOTE — Progress Notes (Signed)
Patient presents today to pick up custom molded foot orthotics, diagnosed with pes cavus by Dr. Posey Pronto.   Orthotics were dispensed and fit was satisfactory. Reviewed instructions for break-in and wear. Written instructions given to patient.  Patient will follow up as needed.

## 2022-05-16 NOTE — Progress Notes (Signed)
Oncology Nurse Navigator Documentation   I called Richard Terry wife Richard Terry to follow up on our 10/4 ENT TB discussion and Dr. Janeice Robinson telephone call to [name]. She understood that a PET was recommended at the end of November to follow pulmonary nodules seen on his 04/13/22 CT chest. She plans to call Dr. Janeice Robinson office to confirm that the PET is planned and scheduled for the recommended time. She also knows to call me if she has any questions or concern in the future.  Harlow Asa RN, BSN, OCN Head & Neck Oncology Nurse [place] [facility] Phone # [phone]  Fax # [phone]

## 2022-05-17 ENCOUNTER — Other Ambulatory Visit (HOSPITAL_COMMUNITY): Payer: Self-pay | Admitting: Otolaryngology

## 2022-05-17 DIAGNOSIS — C7989 Secondary malignant neoplasm of other specified sites: Secondary | ICD-10-CM

## 2022-05-25 ENCOUNTER — Telehealth: Payer: Self-pay

## 2022-05-26 NOTE — Telephone Encounter (Signed)
error 

## 2022-06-12 ENCOUNTER — Ambulatory Visit (INDEPENDENT_AMBULATORY_CARE_PROVIDER_SITE_OTHER): Payer: BC Managed Care – PPO | Admitting: Podiatry

## 2022-06-12 DIAGNOSIS — M7741 Metatarsalgia, right foot: Secondary | ICD-10-CM

## 2022-06-12 DIAGNOSIS — M7742 Metatarsalgia, left foot: Secondary | ICD-10-CM

## 2022-06-12 DIAGNOSIS — Q667 Congenital pes cavus, unspecified foot: Secondary | ICD-10-CM

## 2022-06-15 ENCOUNTER — Encounter (HOSPITAL_COMMUNITY)
Admission: RE | Admit: 2022-06-15 | Discharge: 2022-06-15 | Disposition: A | Discharge status: Home / Self Care | Payer: BC Managed Care – PPO | Source: Ambulatory Visit | Attending: Otolaryngology | Admitting: Otolaryngology

## 2022-06-15 DIAGNOSIS — C7989 Secondary malignant neoplasm of other specified sites: Secondary | ICD-10-CM | POA: Insufficient documentation

## 2022-06-15 DIAGNOSIS — R918 Other nonspecific abnormal finding of lung field: Secondary | ICD-10-CM | POA: Diagnosis not present

## 2022-06-15 DIAGNOSIS — C7800 Secondary malignant neoplasm of unspecified lung: Secondary | ICD-10-CM | POA: Diagnosis not present

## 2022-06-15 DIAGNOSIS — C801 Malignant (primary) neoplasm, unspecified: Secondary | ICD-10-CM | POA: Diagnosis not present

## 2022-06-15 LAB — GLUCOSE, CAPILLARY: Glucose-Capillary: 132 mg/dL — ABNORMAL HIGH (ref 70–99)

## 2022-06-15 MED ORDER — FLUDEOXYGLUCOSE F - 18 (FDG) INJECTION
7.4000 | Freq: Once | INTRAVENOUS | Status: AC
  Administered 2022-06-15: 7.4 via INTRAVENOUS

## 2022-06-30 ENCOUNTER — Other Ambulatory Visit: Payer: Self-pay

## 2022-06-30 DIAGNOSIS — R918 Other nonspecific abnormal finding of lung field: Secondary | ICD-10-CM

## 2022-07-03 ENCOUNTER — Telehealth: Payer: Self-pay | Admitting: *Deleted

## 2022-07-03 NOTE — Telephone Encounter (Signed)
Spoke with the wife and patient will need a f/u appointment since medication and orthotics are not seemed to be working,please scheduled.

## 2022-07-04 NOTE — Progress Notes (Signed)
Arne Cleveland, MD  Riley Lam Ok  CT core RUL pleural based nodule  [facility]

## 2022-07-05 ENCOUNTER — Ambulatory Visit (INDEPENDENT_AMBULATORY_CARE_PROVIDER_SITE_OTHER): Payer: BC Managed Care – PPO | Admitting: Podiatry

## 2022-07-05 DIAGNOSIS — Q667 Congenital pes cavus, unspecified foot: Secondary | ICD-10-CM | POA: Diagnosis not present

## 2022-07-05 DIAGNOSIS — M7742 Metatarsalgia, left foot: Secondary | ICD-10-CM | POA: Diagnosis not present

## 2022-07-05 DIAGNOSIS — M7741 Metatarsalgia, right foot: Secondary | ICD-10-CM | POA: Diagnosis not present

## 2022-07-05 MED ORDER — MELOXICAM 15 MG PO TABS: 15.0000 mg | ORAL_TABLET | Freq: Every day | ORAL | 1 refills | Status: AC

## 2022-07-05 NOTE — Progress Notes (Signed)
Subjective:  Patient ID: Richard Slipper., male    DOB: [birth year],  MRN: [id number]  Chief Complaint  Patient presents with   Foot Pain    Bilateral foot pain on the top of the foot and bottom of the foot     61 y.o. male presents with the above complaint.  Patient presents for follow-up of bilateral forefoot metatarsalgia with underlying pes cavus deformity.  Patient states he is got his orthotics and he seems to be fitting well.  He states is still there but not as painful.  He wanted to get it evaluated hurts with ambulation worse with pressure however.  Denies any other acute complaints pain scale is 2 out of 10  Review of Systems: Negative except as noted in the HPI. Denies N/V/F/Ch.  Past Medical History:  Diagnosis Date   Anxiety    Arthritis    Cancer ([place])    metastatic squamous cell neck cancer   Cough    smoker's cough last 5 years in morning when wakes up per wife on 03-13-2022   GERD (gastroesophageal reflux disease)    History of radiation therapy    11-23-2021 to 12-16-2021, completed 15 of 30 scheduled radiation treatments   Right carpal tunnel syndrome    Right eye injury    removed with prosthesis    Current Outpatient Medications:    meloxicam (MOBIC) 15 MG tablet, Take 1 tablet (15 mg total) by mouth daily., Disp: 30 tablet, Rfl: 0   methylPREDNISolone (MEDROL DOSEPAK) 4 MG TBPK tablet, Take as directed, Disp: 21 each, Rfl: 0   albuterol (VENTOLIN HFA) 108 (90 Base) MCG/ACT inhaler, Inhale 1-2 puffs into the lungs every 6 (six) hours as needed for wheezing or shortness of breath., Disp: , Rfl:    calcium elemental as carbonate (TUMS ULTRA 1000) 400 MG chewable tablet, Chew 1,000-2,000 mg by mouth 2 (two) times daily as needed for heartburn., Disp: , Rfl:    Carboxymethylcellul-Glycerin (LUBRICATING EYE DROPS OP), Place 1 drop into both eyes 2 (two) times daily as needed (dry eyes)., Disp: , Rfl:    diclofenac Sodium (VOLTAREN) 1 % GEL, Apply 1 application.  topically 4 (four) times daily as needed (pain)., Disp: , Rfl:    escitalopram (LEXAPRO) 20 MG tablet, Take 20 mg by mouth daily., Disp: , Rfl:    Multiple Vitamin (MULTIVITAMIN WITH MINERALS) TABS tablet, Take 1 tablet by mouth daily., Disp: , Rfl:   Social History   Tobacco Use  Smoking Status Every Day   Packs/day: 1.00   Years: 13.00   Total pack years: 13.00   Types: Cigarettes  Smokeless Tobacco Never    Allergies  Allergen Reactions   Bupropion Other (See Comments)    Caused bloodshot eyes   Objective:  There were no vitals filed for this visit. There is no height or weight on file to calculate BMI. Constitutional Well developed. Well nourished.  Vascular Dorsalis pedis pulses palpable bilaterally. Posterior tibial pulses palpable bilaterally. Capillary refill normal to all digits.  No cyanosis or clubbing noted. Pedal hair growth normal.  Neurologic Normal speech. Oriented to person, place, and time. Epicritic sensation to light touch grossly present bilaterally.  Dermatologic Nails well groomed and normal in appearance. No open wounds. No skin lesions.  Orthopedic: Gait examination shows pes cavus foot structure with forefoot pressure.  Forefoot generalized metatarsalgia noted bilaterally.  No pain on palpation to the individual metatarsophalangeal joint no extensor or flexor tendinitis noted.  No pain at the  sesamoidal complex   Radiographs: 3 views of skeletally mature adult bilateral foot: High arch foot structure noted subtalar joint arthritis noted.  No other bony abnormalities identified.  No bunion deformity noted. Assessment:   1. Pes cavus   2. Metatarsalgia of both feet    Plan:  Patient was evaluated and treated and all questions answered.  Bilateral forefoot metatarsalgia with underlying pes cavus foot type -All questions and concerns were discussed with the patient in extensive detail -patient will manage with orthotics and shoe gear  modification. -If any foot and ankle issues on future of asked her to come back and see me.  He states understanding No follow-ups on file.

## 2022-08-01 ENCOUNTER — Ambulatory Visit (HOSPITAL_COMMUNITY): Payer: BC Managed Care – PPO

## 2022-08-03 ENCOUNTER — Other Ambulatory Visit: Payer: Self-pay | Admitting: Radiology

## 2022-08-03 DIAGNOSIS — R911 Solitary pulmonary nodule: Secondary | ICD-10-CM

## 2022-08-07 ENCOUNTER — Other Ambulatory Visit: Payer: Self-pay | Admitting: Radiology

## 2022-08-07 NOTE — H&P (Signed)
Chief Complaint: Patient was seen in consultation today for pulm nodule biopsy at the request of Rosen,Jefry  Referring Physician(s): Rosen,Jefry  Supervising Physician: Daryll Brod  Patient Status: [facility] - Out-pt  History of Present Illness: Richard [place]. is a 62 y.o. male with PMHs of metastatic squamous cell neck cancer s/p radiation therapy and recent finding of new enlarging hypermetabolic pulmonary metastases who presents for biopsy.   Patient was diagnosed with squamous cell neck cancer in February 2023 and underwent curative radiation therapy. CT soft tissue neck on 04/13/22 showed post treatment changes without evidence of recurrent disease in the neck, however, CT chest on the same date showed new bilateral subpleural solid pulmonary nodules, highly concerning for progressive metastatic disease. PET scan on 06/16/22 showed enlarging hypermetabolic pulmonary metastases.   IR was requested for image guided pulm nodule biopsy.  Bx approval form was submitted, approved for CT guided RUL nodule biopsy by Dr. Vernard Gambles.   Patient laying in bed, not in acute distress.  States that he is cold.  Denise headache, fever, chills, shortness of breath, cough, chest pain, abdominal pain, nausea ,vomiting, and bleeding.    Past Medical History:  Diagnosis Date   Anxiety    Arthritis    Cancer ([place])    metastatic squamous cell neck cancer   Cough    smoker's cough last 5 years in morning when wakes up per wife on 03-13-2022   GERD (gastroesophageal reflux disease)    History of radiation therapy    11-23-2021 to 12-16-2021, completed 15 of 30 scheduled radiation treatments   Right carpal tunnel syndrome    Right eye injury    removed with prosthesis    Past Surgical History:  Procedure Laterality Date   ANTERIOR CERVICAL DECOMP/DISCECTOMY FUSION N/A 10/22/2012   Procedure: ANTERIOR CERVICAL DECOMPRESSION/DISCECTOMY FUSION 3 LEVELS;  Surgeon: Otilio Connors, MD;  Location: [place]  NEUR[facility];  Service: Neurosurgery;  Laterality: N/A;  Cervical four-five,five-six,six-seven Anterior cervical decompression/diskectomy/fusion/allograft/trestle plate   CARPAL TUNNEL WITH CUBITAL TUNNEL Right 03/28/2022   Procedure: Right Carpal Tunnel Release, Right cubital tunnel release in situ;  Surgeon: [name], MD;  Location[facility];  Service: Orthopedics;  Laterality: Right;   ESOPHAGOSCOPY N/A 10/10/2021   Procedure: ESOPHAGOSCOPY;  Surgeon: Izora Gala, MD;  Location: [place];  Service: ENT;  Laterality: N/A;   EYE SURGERY Right 01/2011   right eyelid surgery   LARYNGOSCOPY AND ESOPHAGOSCOPY N/A 10/10/2021   Procedure: DIRECT LARYNGOSCOPY WITH MULTIPLE BIOPSIES;  Surgeon: Izora Gala, MD;  Location: [place];  Service: ENT;  Laterality: N/A;   NASOPHARYNGOSCOPY N/A 10/10/2021   Procedure: NASOPHARYNGOSCOPY;  Surgeon: Izora Gala, MD;  Location: [place];  Service: ENT;  Laterality: N/A;   RADICAL NECK DISSECTION Left 10/10/2021   Procedure: MODIFIED RADICAL NECK DISSECTION;  Surgeon: Izora Gala, MD;  Location: [place];  Service: ENT;  Laterality: Left;   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 Authorizing Provider  albuterol (VENTOLIN HFA) 108 (90 Base) MCG/ACT inhaler Inhale 1-2 puffs into the lungs every 6 (six) hours as needed for  wheezing or shortness of breath. 01/16/20   [provider]  calcium elemental as carbonate (TUMS ULTRA 1000) 400 MG chewable tablet Chew 1,000-2,000 mg by mouth 2 (two) times daily as needed for heartburn.    [provider]   Carboxymethylcellul-Glycerin (LUBRICATING EYE DROPS OP) Place 1 drop into both eyes 2 (two) times daily as needed (dry eyes).    [provider]  diclofenac Sodium (VOLTAREN) 1 % GEL Apply 1 application. topically 4 (four) times daily as needed (pain).    [provider]  escitalopram (LEXAPRO) 20 MG tablet Take 20 mg by mouth daily. 08/30/21   [provider]  meloxicam (MOBIC) 15 MG tablet Take 1 tablet (15 mg total) by mouth daily. 07/05/22 09/03/22  Felipa Furnace, DPM  methylPREDNISolone (MEDROL DOSEPAK) 4 MG TBPK tablet Take as directed 04/26/22   Felipa Furnace, DPM  Multiple Vitamin (MULTIVITAMIN WITH MINERALS) TABS tablet Take 1 tablet by mouth daily.    [provider]     Family History  Problem Relation Age of Onset   Bladder Cancer Mother     Social History   Socioeconomic History   Marital status: Married    Spouse name: Not on file   Number of children: Not on file   Years of education: Not on file   Highest education level: Not on file  Occupational History   Not on file  Tobacco Use   Smoking status: Every Day    Packs/day: 1.00    Years: 13.00    Total pack years: 13.00    Types: Cigarettes   Smokeless tobacco: Never  Vaping Use   Vaping Use: Former   Substances: Nicotine  Substance and Sexual Activity   Alcohol use: Yes    Alcohol/week: 50.0 standard drinks of alcohol    Types: 50 Cans of beer per week    Comment: daily 8 light beers   Drug use: No   Sexual activity: Yes    Birth control/protection: None  Other Topics Concern   Not on file  Social History Narrative   Not on file   Social Determinants of Health   Financial Resource Strain: Not on file  Food Insecurity: Not on file  Transportation Needs: Not on file  Physical Activity: Not on file  Stress: Not on file  Social Connections: Not on file     Review of Systems: A 12 point ROS discussed and pertinent positives are indicated in the HPI above.  All  other systems are negative.  Vital Signs: BP 131/86   Pulse 62   Temp 98 F (36.7 C) (Oral)   Resp 15   Ht '6\' 2"'$  (1.88 m)   Wt 145 lb (65.8 kg)   SpO2 98%   BMI 18.62 kg/m   Physical Exam Vitals and nursing note reviewed.  Constitutional:      General: Patient is not in acute distress.    Appearance: Normal appearance. Patient is not ill-appearing.  HENT:     Head: Normocephalic and atraumatic.     Mouth/Throat:     Mouth: Mucous membranes are moist.     Pharynx: Oropharynx is clear.  Cardiovascular:     Rate and Rhythm: Normal rate and regular rhythm.     Pulses: Normal pulses.     Heart sounds: Normal heart sounds.  Pulmonary:     Effort: Pulmonary effort is normal.     Breath sounds: Normal breath sounds.  Abdominal:     General: Abdomen  is flat. Bowel sounds are normal.     Palpations: Abdomen is soft.  Musculoskeletal:     Cervical back: Neck supple.  Skin:    General: Skin is warm and dry.     Coloration: Skin is not jaundiced or pale.  Neurological:     Mental Status: Patient is alert and oriented to person, place, and time.  Psychiatric:        Mood and Affect: Mood normal.        Behavior: Behavior normal.        Judgment: Judgment normal.     MD Evaluation Airway: WNL Heart: WNL Abdomen: WNL Chest/ Lungs: WNL ASA  Classification: 3 Mallampati/Airway Score: One  Imaging: No results found.  Labs:  CBC: Recent Labs    10/06/21 1025 08/08/22 0835  WBC 12.3* 8.4  HGB 15.0 14.4  HCT 43.4 43.3  PLT 409* 372    COAGS: Recent Labs    08/08/22 0835  INR 1.1    BMP: Recent Labs    10/06/21 1025  NA 138  K 4.2  CL 106  CO2 22  GLUCOSE 161*  BUN 13  CALCIUM 10.6*  CREATININE 1.04  GFRNONAA >60    LIVER FUNCTION TESTS: Recent Labs    10/06/21 1025  BILITOT 0.8  AST 29  ALT 23  ALKPHOS 71  PROT 7.5  ALBUMIN 4.0    TUMOR MARKERS: No results for input(s): "AFPTM", "CEA", "CA199", "CHROMGRNA" in the last 8760  hours.  Assessment and Plan: 62 y.o. male with neck CA s/p radiation presents with new pulmonary nodules concern for metastatic CA. He is in need of pulm nodule biopsy.   NPO MN VSS CBC wnl INR 1.1 Not on AC/AP  Risks and benefits of CT guided lung nodule biopsy was discussed with the patient including, but not limited to bleeding, hemoptysis, respiratory failure requiring intubation, infection, pneumothorax requiring chest tube placement, stroke from air embolism or even death.  All of the patient's questions were answered and the patient is agreeable to proceed.  Consent signed and in chart.   Thank you for this interesting consult.  I greatly enjoyed meeting Richard Terry. and look forward to participating in their care.  A copy of this report was sent to the requesting provider on this date.  Electronically Signed: Tera Mater, PA-C 08/08/2022, 9:40 AM   I spent a total of  30 min  in face to face in clinical consultation, greater than 50% of which was counseling/coordinating care for RUL nodule bx.   This chart was dictated using voice recognition software.  Despite best efforts to proofread,  errors can occur which can change the documentation meaning.

## 2022-08-08 ENCOUNTER — Other Ambulatory Visit: Payer: Self-pay

## 2022-08-08 ENCOUNTER — Ambulatory Visit (HOSPITAL_COMMUNITY)
Admission: RE | Admit: 2022-08-08 | Discharge: 2022-08-08 | Disposition: A | Discharge status: Home / Self Care | Payer: BC Managed Care – PPO | Source: Ambulatory Visit | Attending: Interventional Radiology | Admitting: Interventional Radiology

## 2022-08-08 ENCOUNTER — Ambulatory Visit (HOSPITAL_COMMUNITY)
Admission: RE | Admit: 2022-08-08 | Discharge: 2022-08-08 | Disposition: A | Discharge status: Home / Self Care | Payer: BC Managed Care – PPO | Source: Ambulatory Visit | Attending: Otolaryngology | Admitting: Otolaryngology

## 2022-08-08 ENCOUNTER — Encounter (HOSPITAL_COMMUNITY): Payer: Self-pay

## 2022-08-08 ENCOUNTER — Encounter (HOSPITAL_COMMUNITY): Payer: Self-pay | Admitting: Interventional Radiology

## 2022-08-08 VITALS — BP 111/75 | HR 64 | Temp 98.0°F | Resp 17 | Ht 74.0 in | Wt 145.0 lb

## 2022-08-08 DIAGNOSIS — R0489 Hemorrhage from other sites in respiratory passages: Secondary | ICD-10-CM | POA: Diagnosis not present

## 2022-08-08 DIAGNOSIS — Z923 Personal history of irradiation: Secondary | ICD-10-CM | POA: Insufficient documentation

## 2022-08-08 DIAGNOSIS — J9383 Other pneumothorax: Secondary | ICD-10-CM | POA: Diagnosis not present

## 2022-08-08 DIAGNOSIS — R918 Other nonspecific abnormal finding of lung field: Secondary | ICD-10-CM | POA: Diagnosis not present

## 2022-08-08 DIAGNOSIS — F17210 Nicotine dependence, cigarettes, uncomplicated: Secondary | ICD-10-CM | POA: Diagnosis not present

## 2022-08-08 DIAGNOSIS — Z9889 Other specified postprocedural states: Secondary | ICD-10-CM | POA: Diagnosis not present

## 2022-08-08 DIAGNOSIS — R911 Solitary pulmonary nodule: Secondary | ICD-10-CM | POA: Diagnosis not present

## 2022-08-08 DIAGNOSIS — C7801 Secondary malignant neoplasm of right lung: Secondary | ICD-10-CM | POA: Insufficient documentation

## 2022-08-08 DIAGNOSIS — J984 Other disorders of lung: Secondary | ICD-10-CM | POA: Diagnosis not present

## 2022-08-08 DIAGNOSIS — J8410 Pulmonary fibrosis, unspecified: Secondary | ICD-10-CM | POA: Diagnosis not present

## 2022-08-08 LAB — PROTIME-INR
INR: 1.1 (ref 0.8–1.2)
Prothrombin Time: 13.7 s (ref 11.4–15.2)

## 2022-08-08 LAB — CBC
HCT: 43.3 % (ref 39.0–52.0)
Hemoglobin: 14.4 g/dL (ref 13.0–17.0)
MCH: 32.3 pg (ref 26.0–34.0)
MCHC: 33.3 g/dL (ref 30.0–36.0)
MCV: 97.1 fL (ref 80.0–100.0)
Platelets: 372 K/uL (ref 150–400)
RBC: 4.46 MIL/uL (ref 4.22–5.81)
RDW: 12.7 % (ref 11.5–15.5)
WBC: 8.4 K/uL (ref 4.0–10.5)
nRBC: 0 % (ref 0.0–0.2)

## 2022-08-08 MED ORDER — FENTANYL CITRATE (PF) 100 MCG/2ML IJ SOLN
INTRAMUSCULAR | Status: AC | PRN
  Administered 2022-08-08: 25 ug via INTRAVENOUS
  Administered 2022-08-08: 50 ug via INTRAVENOUS

## 2022-08-08 MED ORDER — FENTANYL CITRATE (PF) 100 MCG/2ML IJ SOLN
INTRAMUSCULAR | Status: AC
  Filled 2022-08-08: qty 2

## 2022-08-08 MED ORDER — SODIUM CHLORIDE 0.9 % IV SOLN: INTRAVENOUS | Status: DC

## 2022-08-08 MED ORDER — MIDAZOLAM HCL 2 MG/2ML IJ SOLN
INTRAMUSCULAR | Status: AC | PRN
  Administered 2022-08-08: 1 mg via INTRAVENOUS
  Administered 2022-08-08: .5 mg via INTRAVENOUS

## 2022-08-08 MED ORDER — LIDOCAINE HCL (PF) 1 % IJ SOLN
10.0000 mL | Freq: Once | INTRAMUSCULAR | Status: AC
  Administered 2022-08-08: 10 mL
  Filled 2022-08-08: qty 10

## 2022-08-08 MED ORDER — MIDAZOLAM HCL 2 MG/2ML IJ SOLN
INTRAMUSCULAR | Status: AC
  Filled 2022-08-08: qty 2

## 2022-08-08 NOTE — Procedures (Signed)
Interventional Radiology Procedure Note  Procedure: CT guided biopsy of posterior RUL pleural based nodule, mx 18g core.  Complications: Local pulmonary hemorrhage, SIR level A Findings: Post bx hemorrhage, no change in VS No PTX  Recommendations: - Bedrest until CXR cleared.  Minimize talking, coughing or otherwise straining.  - Follow up 1 hr CXR pending  - NPO until CXR cleared  Signed,  Corrie Mckusick, DO

## 2022-08-08 NOTE — Progress Notes (Signed)
Spoke to [place] and per Aimee Dr Earleen Newport saw CXR and ok to d/c client home now

## 2022-08-10 LAB — SURGICAL PATHOLOGY

## 2022-09-27 DIAGNOSIS — G5602 Carpal tunnel syndrome, left upper limb: Secondary | ICD-10-CM | POA: Diagnosis not present

## 2022-09-27 DIAGNOSIS — G5621 Lesion of ulnar nerve, right upper limb: Secondary | ICD-10-CM | POA: Diagnosis not present

## 2022-10-27 DIAGNOSIS — F419 Anxiety disorder, unspecified: Secondary | ICD-10-CM | POA: Diagnosis not present

## 2022-10-27 DIAGNOSIS — C760 Malignant neoplasm of head, face and neck: Secondary | ICD-10-CM | POA: Diagnosis not present

## 2022-10-27 DIAGNOSIS — I700 Atherosclerosis of aorta: Secondary | ICD-10-CM | POA: Diagnosis not present

## 2022-10-27 DIAGNOSIS — G629 Polyneuropathy, unspecified: Secondary | ICD-10-CM | POA: Diagnosis not present

## 2022-11-27 DIAGNOSIS — M79641 Pain in right hand: Secondary | ICD-10-CM | POA: Diagnosis not present

## 2022-11-27 DIAGNOSIS — M79642 Pain in left hand: Secondary | ICD-10-CM | POA: Diagnosis not present

## 2022-11-27 DIAGNOSIS — G629 Polyneuropathy, unspecified: Secondary | ICD-10-CM | POA: Diagnosis not present

## 2022-11-27 DIAGNOSIS — R030 Elevated blood-pressure reading, without diagnosis of hypertension: Secondary | ICD-10-CM | POA: Diagnosis not present

## 2023-01-02 DIAGNOSIS — R030 Elevated blood-pressure reading, without diagnosis of hypertension: Secondary | ICD-10-CM | POA: Diagnosis not present

## 2023-01-02 DIAGNOSIS — M2550 Pain in unspecified joint: Secondary | ICD-10-CM | POA: Diagnosis not present

## 2023-01-15 DIAGNOSIS — M7989 Other specified soft tissue disorders: Secondary | ICD-10-CM | POA: Diagnosis not present

## 2023-01-15 DIAGNOSIS — Z79899 Other long term (current) drug therapy: Secondary | ICD-10-CM | POA: Diagnosis not present

## 2023-01-15 DIAGNOSIS — M0579 Rheumatoid arthritis with rheumatoid factor of multiple sites without organ or systems involvement: Secondary | ICD-10-CM | POA: Diagnosis not present

## 2023-01-15 DIAGNOSIS — M1990 Unspecified osteoarthritis, unspecified site: Secondary | ICD-10-CM | POA: Diagnosis not present

## 2023-01-15 DIAGNOSIS — M79643 Pain in unspecified hand: Secondary | ICD-10-CM | POA: Diagnosis not present

## 2023-01-15 DIAGNOSIS — M79641 Pain in right hand: Secondary | ICD-10-CM | POA: Diagnosis not present

## 2023-01-15 DIAGNOSIS — M79672 Pain in left foot: Secondary | ICD-10-CM | POA: Diagnosis not present

## 2023-01-15 DIAGNOSIS — M79671 Pain in right foot: Secondary | ICD-10-CM | POA: Diagnosis not present

## 2023-01-15 DIAGNOSIS — M25562 Pain in left knee: Secondary | ICD-10-CM | POA: Diagnosis not present

## 2023-01-15 DIAGNOSIS — M25561 Pain in right knee: Secondary | ICD-10-CM | POA: Diagnosis not present

## 2023-01-15 DIAGNOSIS — M79642 Pain in left hand: Secondary | ICD-10-CM | POA: Diagnosis not present

## 2023-01-30 DIAGNOSIS — M0579 Rheumatoid arthritis with rheumatoid factor of multiple sites without organ or systems involvement: Secondary | ICD-10-CM | POA: Diagnosis not present

## 2023-03-26 DIAGNOSIS — M1990 Unspecified osteoarthritis, unspecified site: Secondary | ICD-10-CM | POA: Diagnosis not present

## 2023-03-26 DIAGNOSIS — M0579 Rheumatoid arthritis with rheumatoid factor of multiple sites without organ or systems involvement: Secondary | ICD-10-CM | POA: Diagnosis not present

## 2023-03-26 DIAGNOSIS — Z79899 Other long term (current) drug therapy: Secondary | ICD-10-CM | POA: Diagnosis not present

## 2023-03-26 DIAGNOSIS — M79643 Pain in unspecified hand: Secondary | ICD-10-CM | POA: Diagnosis not present

## 2023-04-14 NOTE — Progress Notes (Signed)
Seen by casting department

## 2023-05-11 IMAGING — CT CT NECK W/ CM
3 of 6 series · 10 of 33 positions shown, 12 images · IV contrast (agent unspecified)
Comparison: Cervical spine CT 02/12/2016
COMPARISON: Cervical spine CT 02/12/2016

Addendum:
CLINICAL DATA: Left neck soreness for 1 month, history of basal
cell carcinoma

EXAM:
CT NECK WITH CONTRAST
TECHNIQUE: Multidetector CT imaging of the neck was performed using the
standard protocol following the bolus administration of intravenous
contrast.

[Series 2: neck 2.00 br40 s3 st/ no angle · axial · 0.38mm/px · z∈[-907,-803]mm · 2 of 156 slices shown, 3 images]
[im 52/156  soft-tissue]
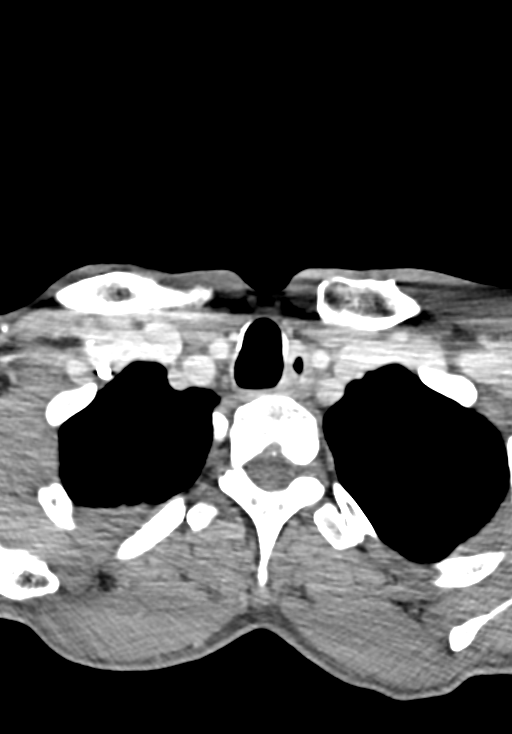
[im 52/156  bone]
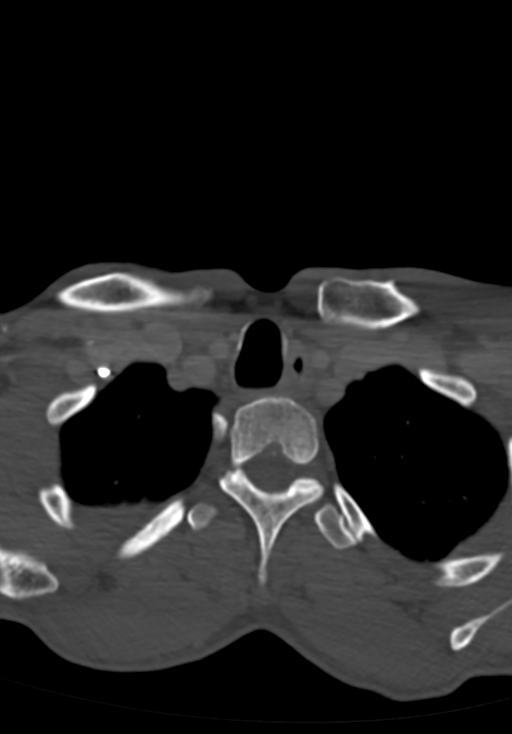
[im 104/156  bone]
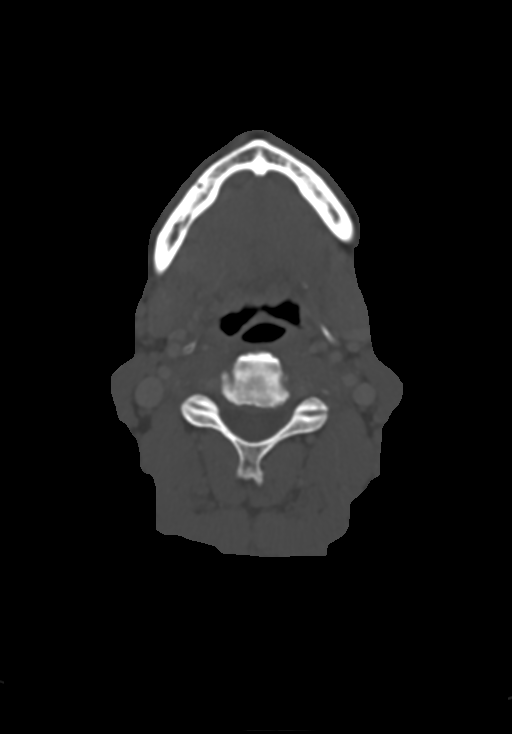

[Series 10: neck 2.00 br40 s3 (person_name) · coronal · 0.36mm/px · 3 of 129 slices shown (1 of 2)]
[im 26/129  bone]
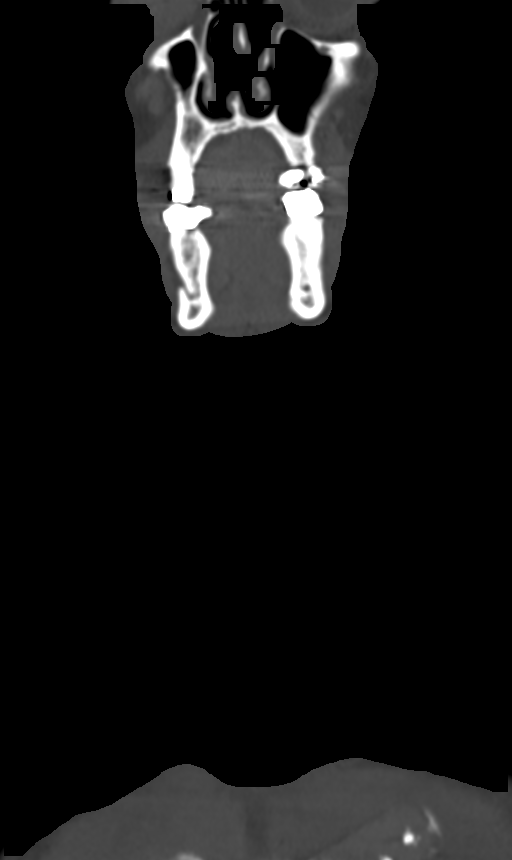
[im 52/129  bone]
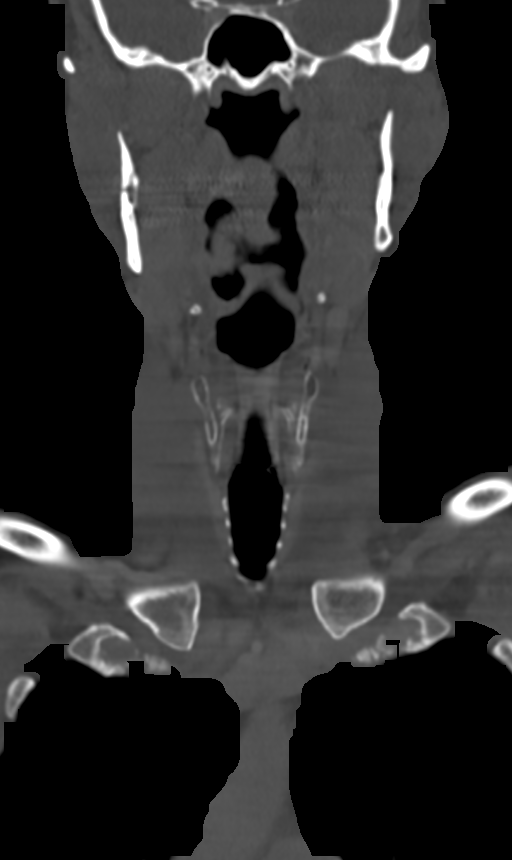
[im 77/129  bone]
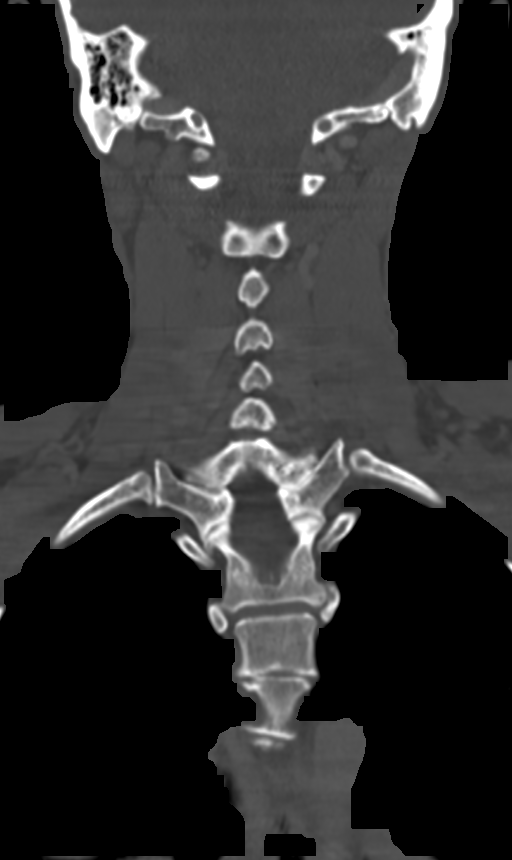

[Series 12: neck 2.00 br40 s3 (person_name) · sagittal · 0.52mm/px · 5 of 109 slices shown, 6 images (2 of 2)]
[im 37/109  bone]
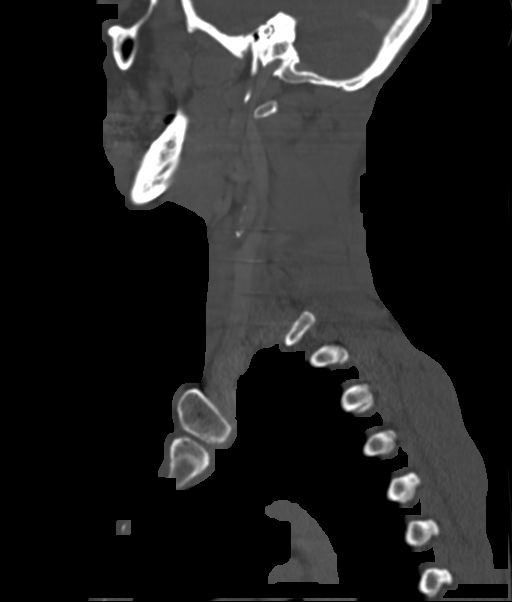
[im 46/109  bone]
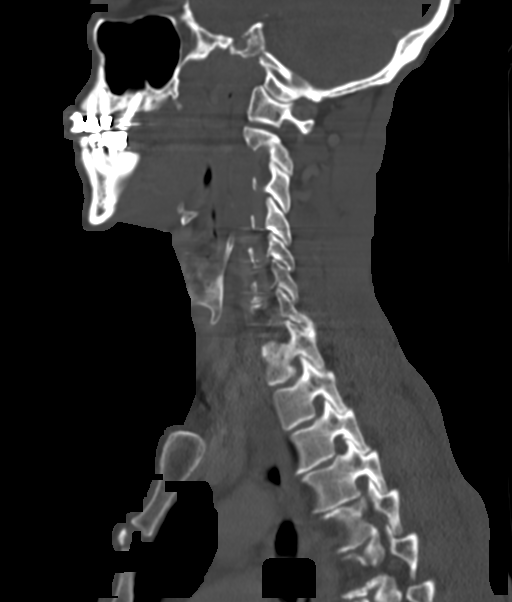
[im 55/109  soft-tissue]
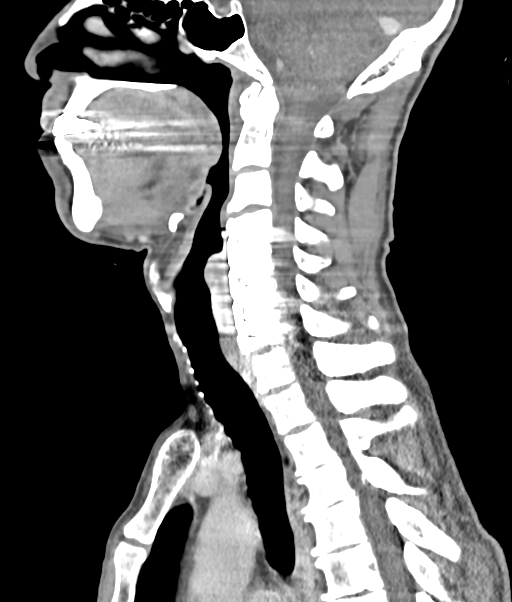
[im 55/109  bone]
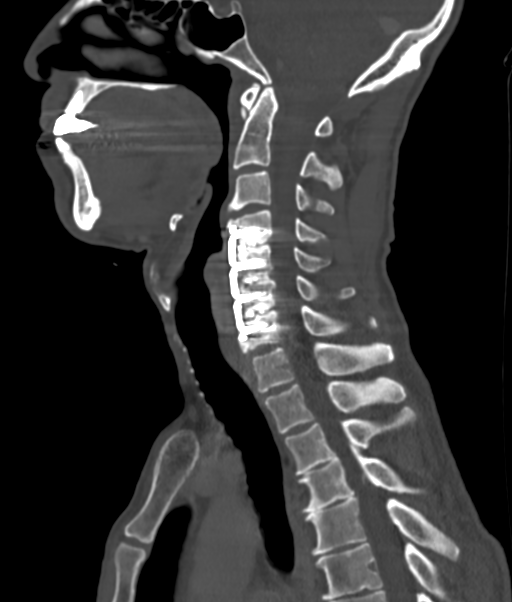
[im 64/109  bone]
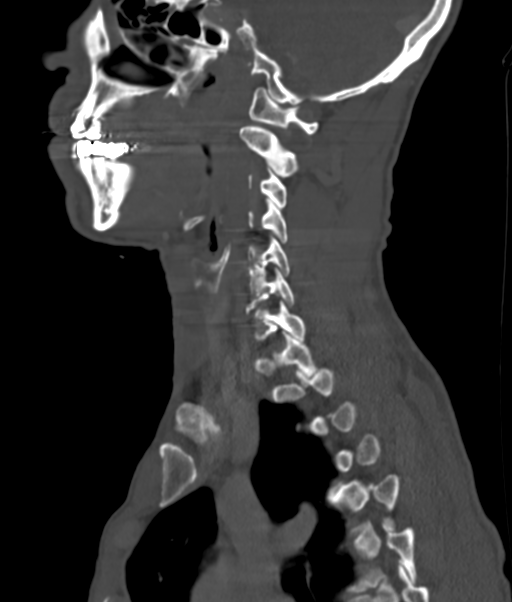
[im 73/109  bone]
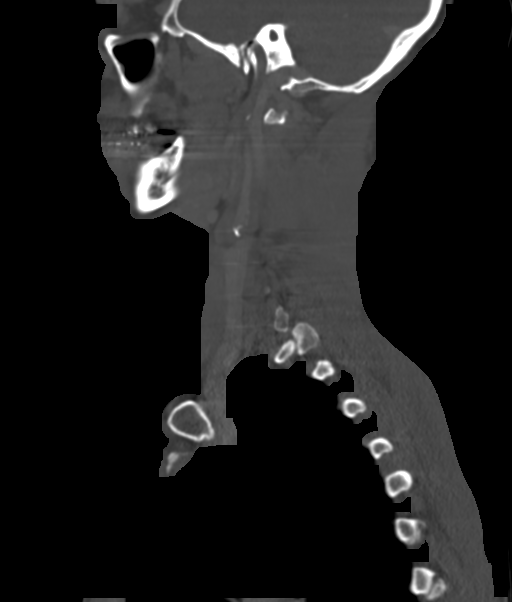

[10 of 33 positions shown; findings below may reference images not displayed]

RADIATION DOSE REDUCTION: This exam was performed according to the
departmental dose-optimization program which includes automated
exposure control, adjustment of the mA and/or kV according to
patient size and/or use of iterative reconstruction technique.

CONTRAST:  75mL ZM7MPC-H33 IOPAMIDOL (ZM7MPC-H33) INJECTION 61%
FINDINGS: Pharynx and larynx: The nasal cavity and nasopharynx are
unremarkable.

The oral cavity and oropharynx are unremarkable. The parapharyngeal
spaces are clear.

The hypopharynx and larynx are unremarkable. The vocal folds and
epiglottis are normal.

There is no abnormal soft tissue lesion or fluid collection.

Salivary glands: The parotid and submandibular glands are
unremarkable.

Thyroid: Unremarkable.

Lymph nodes: There is no pathologic lymphadenopathy in the neck.

Vascular: There is calcified atherosclerotic plaque in the bilateral
carotid bulbs without high-grade stenosis. The jugular veins are
patent.

Limited intracranial: The imaged portions of the intracranial
compartment are unremarkable.

Visualized orbits: There is hyperdensity in the incompletely imaged
right globe. The imaged portions of the left globe are unremarkable.

Mastoids and visualized paranasal sinuses: The imaged paranasal
sinuses are clear. The mastoid air cells are clear.

Skeleton: Postsurgical changes reflecting C4 through C7 ACDF are
noted without evidence of complication. There is no acute osseous
abnormality or aggressive osseous lesion.

Upper chest: The imaged lung apices are clear.

Other: None.
IMPRESSION: 1. No acute abnormality in the neck to account for the patient's
symptoms. No abnormal soft tissue mass, fluid collection, or
pathologic lymphadenopathy.
2. Hyperdensity in the incompletely imaged right globe may reflect
history of silicone injection. Correlate with history.

ADDENDUM:
Original report by Dr. Bagchi. Addendum by Dr. Oladiran on 08/31/2021 at
[DATE] a.m. after discussion in multidisciplinary oncology conference.
There is a 2 cm left level V mass (series 2, image 60 and series 12,
image 77) which is only subtly hyperenhancing relative to adjacent
muscle and corresponds to an FDG-avid lesion on a subsequent PET-CT.

*** End of Addendum ***
RADIATION DOSE REDUCTION: This exam was performed according to the
departmental dose-optimization program which includes automated
exposure control, adjustment of the mA and/or kV according to
patient size and/or use of iterative reconstruction technique.

CONTRAST:  75mL ZM7MPC-H33 IOPAMIDOL (ZM7MPC-H33) INJECTION 61%
FINDINGS: Pharynx and larynx: The nasal cavity and nasopharynx are
unremarkable.

The oral cavity and oropharynx are unremarkable. The parapharyngeal
spaces are clear.

The hypopharynx and larynx are unremarkable. The vocal folds and
epiglottis are normal.

There is no abnormal soft tissue lesion or fluid collection.

Salivary glands: The parotid and submandibular glands are
unremarkable.

Thyroid: Unremarkable.

Lymph nodes: There is no pathologic lymphadenopathy in the neck.

Vascular: There is calcified atherosclerotic plaque in the bilateral
carotid bulbs without high-grade stenosis. The jugular veins are
patent.

Limited intracranial: The imaged portions of the intracranial
compartment are unremarkable.

Visualized orbits: There is hyperdensity in the incompletely imaged
right globe. The imaged portions of the left globe are unremarkable.

Mastoids and visualized paranasal sinuses: The imaged paranasal
sinuses are clear. The mastoid air cells are clear.

Skeleton: Postsurgical changes reflecting C4 through C7 ACDF are
noted without evidence of complication. There is no acute osseous
abnormality or aggressive osseous lesion.

Upper chest: The imaged lung apices are clear.

Other: None.
IMPRESSION: 1. No acute abnormality in the neck to account for the patient's
symptoms. No abnormal soft tissue mass, fluid collection, or
pathologic lymphadenopathy.
2. Hyperdensity in the incompletely imaged right globe may reflect
history of silicone injection. Correlate with history.

## 2023-05-21 DIAGNOSIS — Z79899 Other long term (current) drug therapy: Secondary | ICD-10-CM | POA: Diagnosis not present

## 2023-05-21 DIAGNOSIS — M1990 Unspecified osteoarthritis, unspecified site: Secondary | ICD-10-CM | POA: Diagnosis not present

## 2023-05-21 DIAGNOSIS — M0579 Rheumatoid arthritis with rheumatoid factor of multiple sites without organ or systems involvement: Secondary | ICD-10-CM | POA: Diagnosis not present

## 2023-05-21 DIAGNOSIS — M79643 Pain in unspecified hand: Secondary | ICD-10-CM | POA: Diagnosis not present

## 2023-07-02 DIAGNOSIS — Z79899 Other long term (current) drug therapy: Secondary | ICD-10-CM | POA: Diagnosis not present

## 2023-07-02 DIAGNOSIS — M0579 Rheumatoid arthritis with rheumatoid factor of multiple sites without organ or systems involvement: Secondary | ICD-10-CM | POA: Diagnosis not present

## 2023-07-02 DIAGNOSIS — M79643 Pain in unspecified hand: Secondary | ICD-10-CM | POA: Diagnosis not present

## 2023-07-02 DIAGNOSIS — M1990 Unspecified osteoarthritis, unspecified site: Secondary | ICD-10-CM | POA: Diagnosis not present

## 2023-08-08 DIAGNOSIS — Z79899 Other long term (current) drug therapy: Secondary | ICD-10-CM | POA: Diagnosis not present

## 2023-08-08 DIAGNOSIS — M0579 Rheumatoid arthritis with rheumatoid factor of multiple sites without organ or systems involvement: Secondary | ICD-10-CM | POA: Diagnosis not present

## 2023-08-27 DIAGNOSIS — M0579 Rheumatoid arthritis with rheumatoid factor of multiple sites without organ or systems involvement: Secondary | ICD-10-CM | POA: Diagnosis not present

## 2023-10-01 DIAGNOSIS — M0579 Rheumatoid arthritis with rheumatoid factor of multiple sites without organ or systems involvement: Secondary | ICD-10-CM | POA: Diagnosis not present

## 2023-10-01 DIAGNOSIS — Z79899 Other long term (current) drug therapy: Secondary | ICD-10-CM | POA: Diagnosis not present

## 2023-10-01 DIAGNOSIS — M1990 Unspecified osteoarthritis, unspecified site: Secondary | ICD-10-CM | POA: Diagnosis not present

## 2023-10-01 DIAGNOSIS — M79643 Pain in unspecified hand: Secondary | ICD-10-CM | POA: Diagnosis not present

## 2023-12-12 DIAGNOSIS — R109 Unspecified abdominal pain: Secondary | ICD-10-CM | POA: Diagnosis not present

## 2023-12-12 DIAGNOSIS — M0579 Rheumatoid arthritis with rheumatoid factor of multiple sites without organ or systems involvement: Secondary | ICD-10-CM | POA: Diagnosis not present

## 2023-12-12 DIAGNOSIS — R350 Frequency of micturition: Secondary | ICD-10-CM | POA: Diagnosis not present

## 2023-12-12 DIAGNOSIS — F419 Anxiety disorder, unspecified: Secondary | ICD-10-CM | POA: Diagnosis not present

## 2023-12-13 DIAGNOSIS — R350 Frequency of micturition: Secondary | ICD-10-CM | POA: Diagnosis not present

## 2023-12-31 DIAGNOSIS — C801 Malignant (primary) neoplasm, unspecified: Secondary | ICD-10-CM | POA: Diagnosis not present

## 2023-12-31 DIAGNOSIS — C7989 Secondary malignant neoplasm of other specified sites: Secondary | ICD-10-CM | POA: Diagnosis not present

## 2024-01-04 ENCOUNTER — Other Ambulatory Visit: Payer: Self-pay | Admitting: Internal Medicine

## 2024-01-04 DIAGNOSIS — R634 Abnormal weight loss: Secondary | ICD-10-CM | POA: Diagnosis not present

## 2024-01-04 DIAGNOSIS — K921 Melena: Secondary | ICD-10-CM | POA: Diagnosis not present

## 2024-01-04 DIAGNOSIS — R1030 Lower abdominal pain, unspecified: Secondary | ICD-10-CM

## 2024-01-04 DIAGNOSIS — Z860100 Personal history of colon polyps, unspecified: Secondary | ICD-10-CM | POA: Diagnosis not present

## 2024-01-07 DIAGNOSIS — M1990 Unspecified osteoarthritis, unspecified site: Secondary | ICD-10-CM | POA: Diagnosis not present

## 2024-01-07 DIAGNOSIS — Z79899 Other long term (current) drug therapy: Secondary | ICD-10-CM | POA: Diagnosis not present

## 2024-01-07 DIAGNOSIS — M79643 Pain in unspecified hand: Secondary | ICD-10-CM | POA: Diagnosis not present

## 2024-01-07 DIAGNOSIS — M0579 Rheumatoid arthritis with rheumatoid factor of multiple sites without organ or systems involvement: Secondary | ICD-10-CM | POA: Diagnosis not present

## 2024-01-08 DIAGNOSIS — C7989 Secondary malignant neoplasm of other specified sites: Secondary | ICD-10-CM | POA: Diagnosis not present

## 2024-01-08 DIAGNOSIS — C801 Malignant (primary) neoplasm, unspecified: Secondary | ICD-10-CM | POA: Diagnosis not present

## 2024-01-11 ENCOUNTER — Inpatient Hospital Stay: Admission: RE | Admit: 2024-01-11 | Source: Ambulatory Visit

## 2024-01-21 ENCOUNTER — Ambulatory Visit
Admission: RE | Admit: 2024-01-21 | Discharge: 2024-01-21 | Disposition: A | Discharge status: Home / Self Care | Source: Ambulatory Visit | Attending: Internal Medicine | Admitting: Internal Medicine

## 2024-01-21 DIAGNOSIS — R1030 Lower abdominal pain, unspecified: Secondary | ICD-10-CM

## 2024-01-21 DIAGNOSIS — K921 Melena: Secondary | ICD-10-CM

## 2024-01-21 DIAGNOSIS — R634 Abnormal weight loss: Secondary | ICD-10-CM

## 2024-01-21 MED ORDER — IOPAMIDOL (ISOVUE-300) INJECTION 61%
100.0000 mL | Freq: Once | INTRAVENOUS | Status: AC | PRN
  Administered 2024-01-21: 100 mL via INTRAVENOUS

## 2024-01-29 DIAGNOSIS — K921 Melena: Secondary | ICD-10-CM | POA: Diagnosis not present

## 2024-01-29 DIAGNOSIS — R634 Abnormal weight loss: Secondary | ICD-10-CM | POA: Diagnosis not present

## 2024-01-29 DIAGNOSIS — D123 Benign neoplasm of transverse colon: Secondary | ICD-10-CM | POA: Diagnosis not present

## 2024-01-29 DIAGNOSIS — K449 Diaphragmatic hernia without obstruction or gangrene: Secondary | ICD-10-CM | POA: Diagnosis not present

## 2024-01-29 DIAGNOSIS — K2930 Chronic superficial gastritis without bleeding: Secondary | ICD-10-CM | POA: Diagnosis not present

## 2024-01-29 DIAGNOSIS — Z09 Encounter for follow-up examination after completed treatment for conditions other than malignant neoplasm: Secondary | ICD-10-CM | POA: Diagnosis not present

## 2024-01-29 DIAGNOSIS — Z860101 Personal history of adenomatous and serrated colon polyps: Secondary | ICD-10-CM | POA: Diagnosis not present

## 2024-01-29 DIAGNOSIS — K2970 Gastritis, unspecified, without bleeding: Secondary | ICD-10-CM | POA: Diagnosis not present

## 2024-01-29 DIAGNOSIS — K640 First degree hemorrhoids: Secondary | ICD-10-CM | POA: Diagnosis not present

## 2024-02-11 DIAGNOSIS — K921 Melena: Secondary | ICD-10-CM | POA: Diagnosis not present

## 2024-03-04 DIAGNOSIS — C7989 Secondary malignant neoplasm of other specified sites: Secondary | ICD-10-CM | POA: Diagnosis not present

## 2024-03-04 DIAGNOSIS — C801 Malignant (primary) neoplasm, unspecified: Secondary | ICD-10-CM | POA: Diagnosis not present

## 2024-03-11 DIAGNOSIS — M0579 Rheumatoid arthritis with rheumatoid factor of multiple sites without organ or systems involvement: Secondary | ICD-10-CM | POA: Diagnosis not present

## 2024-03-18 DIAGNOSIS — C4441 Basal cell carcinoma of skin of scalp and neck: Secondary | ICD-10-CM | POA: Diagnosis not present

## 2024-03-18 DIAGNOSIS — L989 Disorder of the skin and subcutaneous tissue, unspecified: Secondary | ICD-10-CM | POA: Diagnosis not present

## 2024-04-01 DIAGNOSIS — C4491 Basal cell carcinoma of skin, unspecified: Secondary | ICD-10-CM | POA: Diagnosis not present

## 2024-04-15 DIAGNOSIS — Z23 Encounter for immunization: Secondary | ICD-10-CM | POA: Diagnosis not present

## 2024-04-15 DIAGNOSIS — Z79899 Other long term (current) drug therapy: Secondary | ICD-10-CM | POA: Diagnosis not present

## 2024-04-15 DIAGNOSIS — M0579 Rheumatoid arthritis with rheumatoid factor of multiple sites without organ or systems involvement: Secondary | ICD-10-CM | POA: Diagnosis not present

## 2024-04-15 DIAGNOSIS — M1990 Unspecified osteoarthritis, unspecified site: Secondary | ICD-10-CM | POA: Diagnosis not present

## 2024-04-15 DIAGNOSIS — M79643 Pain in unspecified hand: Secondary | ICD-10-CM | POA: Diagnosis not present
